# Patient Record
Sex: Female | Born: 1960 | Race: Black or African American | Hispanic: No | Marital: Single | Smoking: Light tobacco smoker
Health system: Southern US, Community
[De-identification: ages and names within clinical notes are randomized; demographics above are authoritative.]

## PROBLEM LIST (undated history)

## (undated) DIAGNOSIS — A64 Unspecified sexually transmitted disease: Secondary | ICD-10-CM

## (undated) DIAGNOSIS — D219 Benign neoplasm of connective and other soft tissue, unspecified: Secondary | ICD-10-CM

## (undated) DIAGNOSIS — B009 Herpesviral infection, unspecified: Secondary | ICD-10-CM

## (undated) DIAGNOSIS — I10 Essential (primary) hypertension: Secondary | ICD-10-CM

## (undated) HISTORY — PX: BREAST LUMPECTOMY: SHX2

## (undated) HISTORY — DX: Unspecified sexually transmitted disease: A64

## (undated) HISTORY — DX: Herpesviral infection, unspecified: B00.9

## (undated) HISTORY — DX: Benign neoplasm of connective and other soft tissue, unspecified: D21.9

---

## 1983-03-27 HISTORY — PX: BREAST SURGERY: SHX581

## 1997-09-11 ENCOUNTER — Emergency Department (HOSPITAL_COMMUNITY): Admission: EM | Admit: 1997-09-11 | Discharge: 1997-09-11 | Payer: Self-pay | Admitting: Emergency Medicine

## 1999-06-20 ENCOUNTER — Ambulatory Visit (HOSPITAL_COMMUNITY): Admission: AD | Admit: 1999-06-20 | Discharge: 1999-06-20 | Payer: Self-pay | Admitting: *Deleted

## 1999-06-20 ENCOUNTER — Ambulatory Visit (HOSPITAL_COMMUNITY): Admission: RE | Admit: 1999-06-20 | Discharge: 1999-06-20 | Payer: Self-pay | Admitting: *Deleted

## 1999-06-20 ENCOUNTER — Encounter (INDEPENDENT_AMBULATORY_CARE_PROVIDER_SITE_OTHER): Payer: Self-pay

## 1999-06-20 ENCOUNTER — Encounter: Payer: Self-pay | Admitting: *Deleted

## 2000-03-03 ENCOUNTER — Observation Stay (HOSPITAL_COMMUNITY): Admission: AD | Admit: 2000-03-03 | Discharge: 2000-03-04 | Payer: Self-pay | Admitting: *Deleted

## 2000-03-03 ENCOUNTER — Encounter (INDEPENDENT_AMBULATORY_CARE_PROVIDER_SITE_OTHER): Payer: Self-pay | Admitting: Specialist

## 2000-12-25 ENCOUNTER — Other Ambulatory Visit: Admission: RE | Admit: 2000-12-25 | Discharge: 2000-12-25 | Payer: Self-pay | Admitting: Obstetrics and Gynecology

## 2001-07-14 ENCOUNTER — Emergency Department (HOSPITAL_COMMUNITY): Admission: EM | Admit: 2001-07-14 | Discharge: 2001-07-14 | Payer: Self-pay | Admitting: *Deleted

## 2001-12-16 ENCOUNTER — Emergency Department (HOSPITAL_COMMUNITY): Admission: EM | Admit: 2001-12-16 | Discharge: 2001-12-16 | Payer: Self-pay | Admitting: Emergency Medicine

## 2003-05-24 ENCOUNTER — Inpatient Hospital Stay (HOSPITAL_COMMUNITY): Admission: AD | Admit: 2003-05-24 | Discharge: 2003-05-28 | Payer: Self-pay | Admitting: Obstetrics & Gynecology

## 2003-05-26 ENCOUNTER — Encounter (INDEPENDENT_AMBULATORY_CARE_PROVIDER_SITE_OTHER): Payer: Self-pay | Admitting: Specialist

## 2005-02-21 ENCOUNTER — Other Ambulatory Visit: Admission: RE | Admit: 2005-02-21 | Discharge: 2005-02-21 | Payer: Self-pay | Admitting: Obstetrics and Gynecology

## 2006-12-03 ENCOUNTER — Emergency Department (HOSPITAL_COMMUNITY): Admission: EM | Admit: 2006-12-03 | Discharge: 2006-12-03 | Payer: Self-pay | Admitting: Emergency Medicine

## 2007-01-14 ENCOUNTER — Ambulatory Visit: Payer: Self-pay | Admitting: Family Medicine

## 2007-01-15 ENCOUNTER — Ambulatory Visit: Payer: Self-pay | Admitting: *Deleted

## 2007-07-08 ENCOUNTER — Ambulatory Visit: Payer: Self-pay | Admitting: Internal Medicine

## 2007-08-12 ENCOUNTER — Emergency Department (HOSPITAL_COMMUNITY): Admission: EM | Admit: 2007-08-12 | Discharge: 2007-08-12 | Payer: Self-pay | Admitting: Emergency Medicine

## 2007-08-29 ENCOUNTER — Ambulatory Visit: Payer: Self-pay | Admitting: Family Medicine

## 2008-04-15 ENCOUNTER — Emergency Department (HOSPITAL_COMMUNITY): Admission: EM | Admit: 2008-04-15 | Discharge: 2008-04-15 | Payer: Self-pay | Admitting: Emergency Medicine

## 2008-07-27 ENCOUNTER — Ambulatory Visit: Payer: Self-pay | Admitting: Family Medicine

## 2008-07-30 ENCOUNTER — Ambulatory Visit (HOSPITAL_COMMUNITY): Admission: RE | Admit: 2008-07-30 | Discharge: 2008-07-30 | Payer: Self-pay | Admitting: Internal Medicine

## 2008-08-27 ENCOUNTER — Encounter: Admission: RE | Admit: 2008-08-27 | Discharge: 2008-08-27 | Payer: Self-pay | Admitting: Internal Medicine

## 2008-10-18 ENCOUNTER — Ambulatory Visit: Payer: Self-pay | Admitting: Family Medicine

## 2008-10-29 ENCOUNTER — Ambulatory Visit: Payer: Self-pay | Admitting: Family Medicine

## 2008-10-29 ENCOUNTER — Encounter (INDEPENDENT_AMBULATORY_CARE_PROVIDER_SITE_OTHER): Payer: Self-pay | Admitting: Family Medicine

## 2010-04-15 ENCOUNTER — Encounter: Payer: Self-pay | Admitting: Obstetrics & Gynecology

## 2010-04-16 ENCOUNTER — Encounter: Payer: Self-pay | Admitting: Obstetrics and Gynecology

## 2010-08-11 NOTE — Discharge Summary (Signed)
[facility]  Patient:    Julia Horton, Julia Horton                       MRN: [id number] Adm. Date:  [id number] Disc. Date: [id number] Attending:  Deniece Ree                           Discharge Summary  HISTORY OF PRESENT ILLNESS:    The patient is a 50 year old gravida 4, para 1, who has had two spontaneous abortions previous to this.  The patient was approximately [redacted] weeks gestation and was diagnosed at the time of evaluation in triage as having a spontaneous incomplete miscarriage.  HOSPITAL COURSE:                The patient was scheduled for dilatation and evacuation from which she underwent and tolerated the procedure well.  She did not have any complications.  Due to the mental status and the lateness of the time of this dilatation and evacuation, the patient was observed over the next 24 hours prior to discharge, and it was felt at that time she was mentally and physically ready for a discharge home.  FOLLOW-UP:                      She was told to return to my office in two weeks for follow-up evaluation or to call me prior to that time should any problems arise. DD:  03/31/00 TD:  03/31/00 Job: [id number]

## 2010-08-11 NOTE — Op Note (Signed)
[facility]  Patient:    Julia Horton, Julia Horton                       MRN: [id number] Adm. Date:  [id number] Attending:  Deniece Ree                           Operative Report  PREOPERATIVE DIAGNOSIS:       Fetal demise at approximately 10-[redacted] weeks gestation.  OPERATION:                    Dilatation and evacuation and curettage.  POSTOPERATIVE DIAGNOSES:      1. Fetal demise at approximately 10-[redacted] weeks                                  gestation.                               2. Pending pathology.  SURGEON:                      Deniece Ree, M.D.  ANESTHESIA:                   MAC.  ESTIMATED BLOOD LOSS:         Approximately 100 cc.  Patient tolerated the procedure well and returned to the recovery room in satisfactory condition.  PROCEDURE:                    Patient was taken to the operating room, prepped nd draped in the usual fashion for dilatation and evacuation.  Speculum was placed in the vagina following which the anterior lip of the cervix was grasped with a Christella Hartigan tenaculum.  A paracervical block using 1% Xylocaine solution was then instituted at the 5 oclock and 7 oclock positions.  Approximately 1 cc was also  placed in the cervical os.  The cervix was then dilated to a size 14 Pratt dilator at which time a size 9 suction curette was introduced through the cervical os and evacuation was carried out in a routine fashion without any difficulty. Following this a sharp curette was utilized to further scrape the uterus and myomas were palpable with the curette.  After this was felt to be clean and recleaned also ith the suction curette, the procedure was terminated.  The patient tolerated the procedure well and returned to the recovery room in satisfactory condition. The patient is to be discharged when fully alert.  She has been instructed on the possible complications ______ following this type of surgery.  She has  been told to return to my office in four weeks for follow-up evaluation or to call me prior to that time should any problems arise. DD:  06/20/99 TD:  06/20/99 Job: [id number]

## 2010-08-11 NOTE — H&P (Signed)
[facility]  Patient:    Julia Horton, Julia Horton                       MRN: [id number] Adm. Date:  [id number] Attending:  Deniece Ree                         History and Physical  HISTORY OF PRESENT ILLNESS:       Patient is a 50 year old gravida 4, para 1, and spontaneous AB 2 whose last menstrual period was December 01, 1999. The patient is approximately [redacted] weeks gestation. Patient began having vaginal bleeding and severe pelvic pain this p.m. at which time she came to triage for evaluation. At time of evaluation there were noted products of conception, all was protruded through the cervical os with moderate amount of bleeding.  PHYSICAL EXAMINATION:  VITAL SIGNS:                  At this time her temperature was 98.7, pulse 78, respirations 20, blood pressure 111/54.  HEENT:                        Within normal limits.  NECK:                         Supple.  BREASTS:                      Without masses, tenderness or discharge.  LUNGS:                        Clear to percussion, auscultation.  HEART:                        Normal sinus rhythm without murmur, rub, or gallop.  ABDOMEN:                      Benign with moderate tenderness in the mid lower quadrant.  EXTREMITIES:                  Within normal limits.  NEUROLOGIC:                   Within normal limits.  PELVIC:                       External genitalia and EGBUS within normal limits. The vagina had approximately 50 to 75 cc of blood and products of conception. The cervix was already dilated to a size 18 dilator. The uterus was approximately 12 weeks in size. The adnexa benign.  DIAGNOSIS:                    The diagnosis at this time was spontaneous and complete abortion.  PLAN:                         Dilatation and evacuation. DD:  03/03/00 TD:  03/03/00 Job: [id number]

## 2010-08-11 NOTE — H&P (Signed)
[facility]  Patient:    Julia Horton, Julia Horton                       MRN: [id number] Adm. Date:  [id number] Attending:  Deniece Ree                         History and Physical  HISTORY:                      Patient is a 50 year old female who is approximately 10-[redacted] weeks pregnant who was evaluated in my office today and also by ultrasound and found that this patient indeed had a fetal demise.  This was explained to the patient and she was then scheduled for a dilatation and evacuation.  PAST MEDICAL HISTORY:         Significant in that two years prior to this time patient had a previous spontaneous abortion at approximately 8-[redacted] weeks gestation.  PHYSICAL EXAMINATION:  GENERAL:                      Revealed a well-developed, well-nourished female n no acute distress.  HEENT:                        Within normal limits.  NECK:                         Supple.  BREASTS:                      Without masses, tenderness, or discharge.  LUNGS:                        Clear to percussion and auscultation.  HEART:                        Normal sinus rhythm without murmur, rub, or gallop.  ABDOMEN:                      Benign.  EXTREMITIES:                  Within normal limits.  NEUROLOGIC:                   Within normal limits.  PELVIC:                       Revealed external genitalia, BUS to be within normal limits.  The vagina had approximately 10-15 cc of blood in the vault and also blood protruding through the cervical os.  The uterus examination reveals a uterus that was approximately 14-16 weeks in size with numerous myomas present.  Adnexa are  benign.  DIAGNOSIS:                    Spontaneous abortion with fetal demise.  PLAN:                         Dilatation and evacuation. DD:  06/20/99 TD:  06/20/99 Job: [id number]

## 2010-08-11 NOTE — Op Note (Signed)
NAMECHRISTELLE, Julia Horton NO.:  [id number]   MEDICAL RECORD NO.:  [id number]                   PATIENT TYPE:  INP   LOCATION:  9320                                 FACILITY:  [facility]   PHYSICIAN:  Roseanna Rainbow, M.D.         DATE OF BIRTH:  October 27, 1960   DATE OF PROCEDURE:  05/26/2003  DATE OF DISCHARGE:                                 OPERATIVE REPORT   PREOPERATIVE DIAGNOSIS:  Fifteen week septic abortion with retained  placenta.   POSTOPERATIVE DIAGNOSIS:  Fifteen week septic abortion with retained  placenta.   PROCEDURE:  Suction dilatation and evacuation.   SURGEON:  Roseanna Rainbow, M.D.   ANESTHESIA:  Paracervical block, managed anesthesia care.   COMPLICATIONS:  None.   ESTIMATED BLOOD LOSS:  Approximately 200 mL.   DESCRIPTION OF PROCEDURE:  The patient was taken to the operating room.  A  sterile speculum was placed in the patient's vagina and the cervix noted to  be 2 cm dilated.  The cervix and vagina were swabbed with Betadine.  Lidocaine 2 mL was injected into the anterior lip of the cervix.  An empty  sponge stick was then applied to this location and 5 mL of lidocaine was  injected at 4 and 7 o'clock to produce a paracervical block.  Using  ultrasound guidance, a suction curette was advanced gently to the uterine  fundus, the suction device was activated, and the curette rotated to clear  the uterus of the remaining products of conception.  Ovum-crushing forceps  were used to also ultimately grasp the retained products of conception.  A  sharp curettage was performed again using ultrasound guidance.  The  appearance of the endometrial stripe at the conclusion of the case was  consistent with removal of the remaining products of conception.  There was  minimal bleeding noted and the sponge stick removed from the anterior lip of  the cervix with adequate hemostasis noted.  The patient tolerated the  procedure well.  The  patient was taken to the PACU in stable condition.   PATHOLOGY:  Products of conception.                                               Roseanna Rainbow, M.D.    [name]  D:  05/27/2003  T:  05/27/2003  Job:  [id number]

## 2010-08-11 NOTE — Op Note (Signed)
[facility]  Patient:    Julia Horton, Julia Horton                       MRN: [id number] Proc. Date: 03/03/00 Adm. Date:  [id number] Attending:  Deniece Ree                           Operative Report  PREOPERATIVE DIAGNOSES:       Spontaneous incomplete abortion with retained products.  POSTOPERATIVE DIAGNOSES:      Spontaneous incomplete abortion with retained products.  OPERATION:                    Dilatation and evacuation and curettage.  SURGEON:                      Deniece Ree, M.D.  ANESTHESIA:                   MAC.  ESTIMATED BLOOD LOSS:         200 cc.  COMPLICATIONS:                None. The patient tolerated the procedure well and returned to the recovery room in satisfactory condition.  DESCRIPTION OF PROCEDURE:     Patient was taken to the operating room, prepped and draped in the usual fashion for dilatation and evacuation. The anterior lip of the cervix was grasped with the [facility] tenaculum. A paracervical block was instituted using 5 cc of 1% Xylocaine bilaterally followed which with a size 10 suction curet. Evacuation of the uterine cavity was then carried out without any problems. After this was felt was to be cleaned, a sharp curet was used and curettage was carried out. The uterus was felt to be clean. At this time procedure was terminated with minimal bleeding at the present time. The patient tolerated the procedure well and returned to the recovery room in satisfactory condition. The patient will be admitted for observation and discharged in less than 24 hours. DD:  03/03/00 TD:  03/03/00 Job: [id number]

## 2010-08-11 NOTE — Discharge Summary (Signed)
NAMEMAANYA, HIPPERT NO.:  [id number]   MEDICAL RECORD NO.:  [id number]                   PATIENT TYPE:  INP   LOCATION:  9320                                 FACILITY:  [facility]   PHYSICIAN:  Roseanna Rainbow, M.D.         DATE OF BIRTH:  Mar 09, 1961   DATE OF ADMISSION:  05/24/2003  DATE OF DISCHARGE:  05/28/2003                                 DISCHARGE SUMMARY   CHIEF COMPLAINT:  Patient is a 50 year old, para 1, with an intrauterine  pregnancy at 15+ weeks with preterm premature rupture of membranes and lower  abdominal cramping.   HISTORY OF PRESENT ILLNESS:  The patient reports leaking fluid for one week.  She was seen in the office earlier today. On speculum exam, the cervix was  closed with purulent discharge for a negative ultrasound on the day of  presentation with hydramnios and positive fetal heart rate seen in the  vaginal vault.   PAST OB/GYN HISTORY:  Cesarean delivery at term; spontaneous abortion first  trimester times two; uterine fibroids; history of gonorrhea and Chlamydia.   PAST MEDICAL HISTORY:  She denies.   PAST SURGICAL HISTORY:  See above. She is status post left breast lumpectomy  for benign disease.   MEDICATIONS:  Prenatal vitamins.   ALLERGIES:  No known drug allergies.   PHYSICAL EXAMINATION:  VITAL SIGNS: Stable, afebrile.  ABDOMEN: Nontender.  PELVIC EXAM: Deferred.   ASSESSMENT/PLAN:  Second trimester preterm premature rupture of membranes,  inevitable abortion.   PLAN:  Admission and Cytotec augmentation.   HOSPITAL COURSE:  The patient was admitted. She was given several doses of  Cytotec intravaginally.  She then passed the fetus; however, the placenta  remained in situ and the patient had heavy bleeding. At which point the  decision was made to bring the patient for suction D&E. Please see the  dictated operative summary for further details.  She was discharged two days  postoperatively.  She had  received parenteral antibiotics during the Cytotec  process, and these were discontinued.   DISCHARGE DIAGNOSES:  1. Second trimester preterm premature rupture of membranes.  2. Inevitable septic abortion.   PROCEDURE:  Suction dilatation and extraction.   CONDITION ON DISCHARGE:  Stable.   DIET:  Regular.   ACTIVITY:  Pelvic rest.   MEDICATIONS:  Percocet, ibuprofen, ferrous sulfate, doxycycline, and Flagyl.   DISPOSITION:  The patient is to follow up in the office on June 11, 2003,  at 11:30 a.m.                                               Roseanna Rainbow, M.D.    [name]  D:  06/23/2003  T:  06/23/2003  Job:  [id number]

## 2010-09-28 ENCOUNTER — Emergency Department (HOSPITAL_COMMUNITY)
Admission: EM | Admit: 2010-09-28 | Discharge: 2010-09-28 | Disposition: A | Discharge status: Home / Self Care | Payer: Self-pay | Attending: Emergency Medicine | Admitting: Emergency Medicine

## 2010-09-28 ENCOUNTER — Emergency Department (HOSPITAL_COMMUNITY): Payer: Self-pay

## 2010-09-28 DIAGNOSIS — N201 Calculus of ureter: Secondary | ICD-10-CM | POA: Insufficient documentation

## 2010-09-28 DIAGNOSIS — I10 Essential (primary) hypertension: Secondary | ICD-10-CM | POA: Insufficient documentation

## 2010-09-28 DIAGNOSIS — R109 Unspecified abdominal pain: Secondary | ICD-10-CM | POA: Insufficient documentation

## 2010-09-28 DIAGNOSIS — R6883 Chills (without fever): Secondary | ICD-10-CM | POA: Insufficient documentation

## 2010-09-28 DIAGNOSIS — R300 Dysuria: Secondary | ICD-10-CM | POA: Insufficient documentation

## 2010-09-28 DIAGNOSIS — R112 Nausea with vomiting, unspecified: Secondary | ICD-10-CM | POA: Insufficient documentation

## 2010-09-28 DIAGNOSIS — R1032 Left lower quadrant pain: Secondary | ICD-10-CM | POA: Insufficient documentation

## 2010-09-28 DIAGNOSIS — R197 Diarrhea, unspecified: Secondary | ICD-10-CM | POA: Insufficient documentation

## 2010-09-28 LAB — URINALYSIS, ROUTINE W REFLEX MICROSCOPIC
Bilirubin Urine: NEGATIVE
Glucose, UA: NEGATIVE mg/dL
Hgb urine dipstick: NEGATIVE
Ketones, ur: 15 mg/dL — AB
Leukocytes, UA: NEGATIVE
Nitrite: NEGATIVE
Protein, ur: NEGATIVE mg/dL
Urobilinogen, UA: 0.2 mg/dL (ref 0.0–1.0)
pH: 7.5 (ref 5.0–8.0)

## 2010-09-28 LAB — POCT PREGNANCY, URINE: Preg Test, Ur: NEGATIVE

## 2010-09-28 LAB — URINE MICROSCOPIC-ADD ON

## 2010-11-30 ENCOUNTER — Inpatient Hospital Stay (INDEPENDENT_AMBULATORY_CARE_PROVIDER_SITE_OTHER)
Admission: RE | Admit: 2010-11-30 | Discharge: 2010-11-30 | Disposition: A | Discharge status: Home / Self Care | Payer: Self-pay | Source: Ambulatory Visit | Attending: Emergency Medicine | Admitting: Emergency Medicine

## 2010-11-30 DIAGNOSIS — M549 Dorsalgia, unspecified: Secondary | ICD-10-CM

## 2010-11-30 DIAGNOSIS — M62838 Other muscle spasm: Secondary | ICD-10-CM

## 2010-11-30 DIAGNOSIS — M799 Soft tissue disorder, unspecified: Secondary | ICD-10-CM

## 2010-11-30 LAB — WET PREP, GENITAL

## 2010-11-30 LAB — POCT PREGNANCY, URINE: Preg Test, Ur: NEGATIVE

## 2010-11-30 LAB — POCT URINALYSIS DIP (DEVICE)
Ketones, ur: NEGATIVE mg/dL
Protein, ur: NEGATIVE mg/dL
Specific Gravity, Urine: >=1.03 (ref 1.005–1.030)

## 2012-12-31 IMAGING — CT CT ABD-PELV W/O CM
3 of 4 series · 14 of 32 positions shown, 19 images · non-contrast
Comparison: None.

CLINICAL DATA: CT ABDOMEN AND PELVIS WITHOUT CONTRAST
TECHNIQUE: Multidetector CT imaging of the abdomen and pelvis was
performed following the standard protocol without intravenous
contrast.

[Series 2: renal stone · axial · 0.70mm/px · z∈[-386,-126]mm · 4 of 88 slices shown, 9 images]
[im 18/88  soft-tissue]
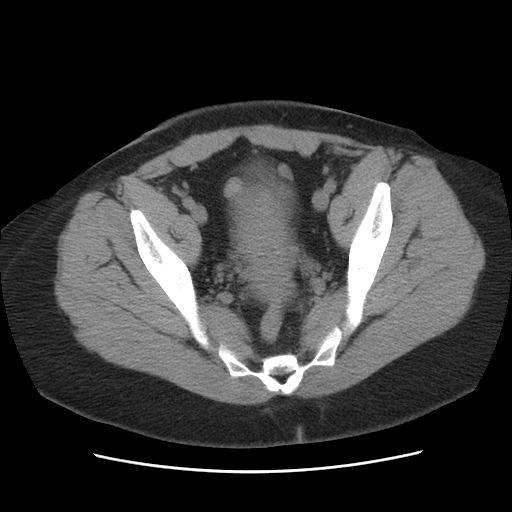
[im 18/88  lung]
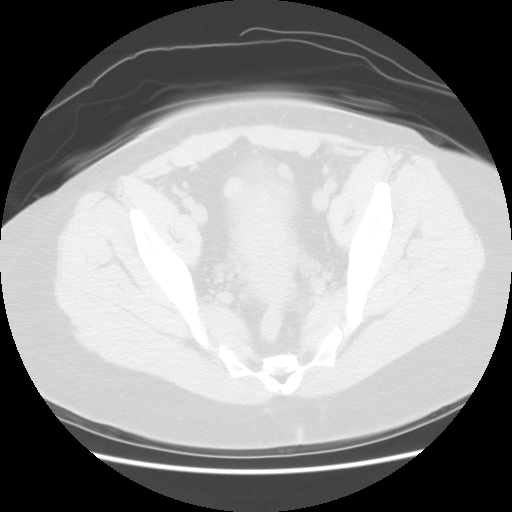
[im 18/88  bone]
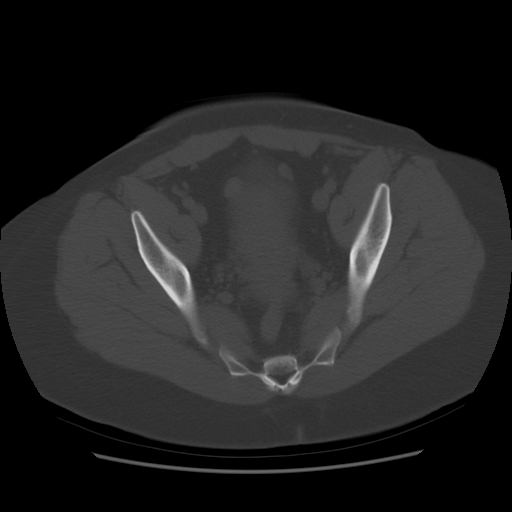
[im 35/88  soft-tissue]
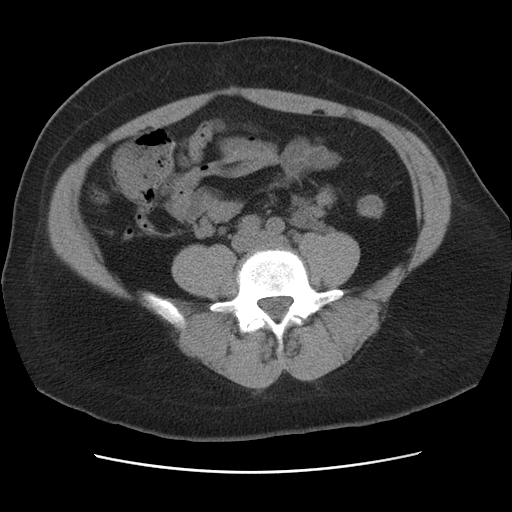
[im 35/88  lung]
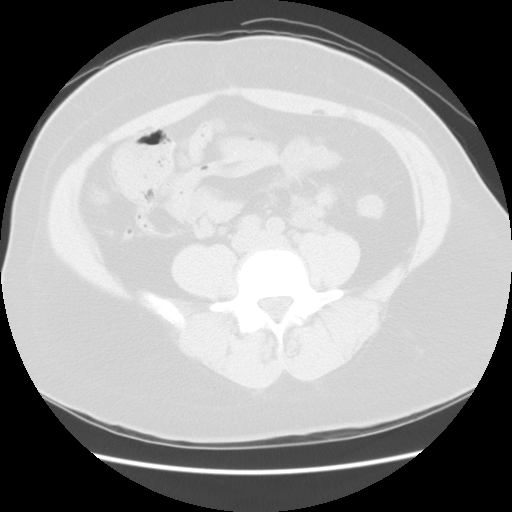
[im 53/88  soft-tissue]
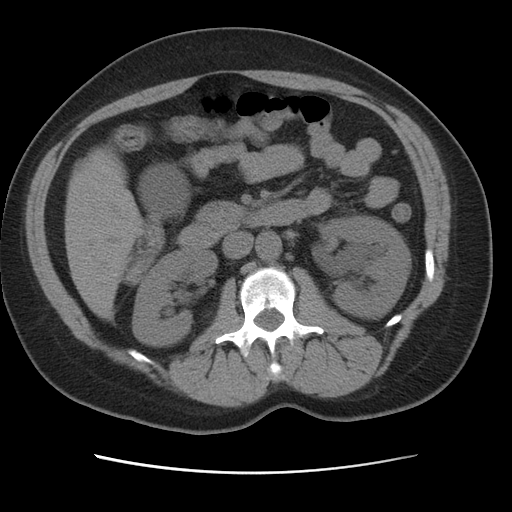
[im 53/88  lung]
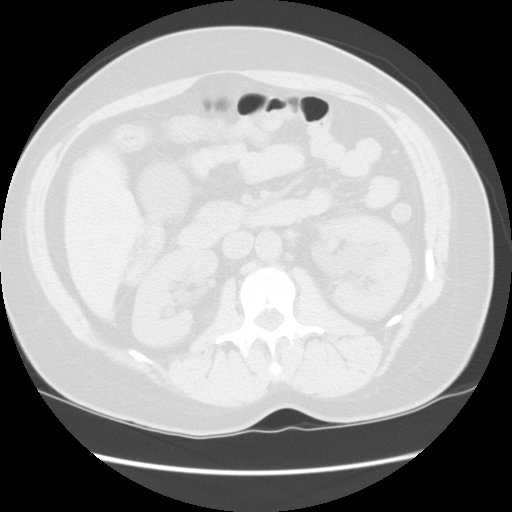
[im 70/88  soft-tissue]
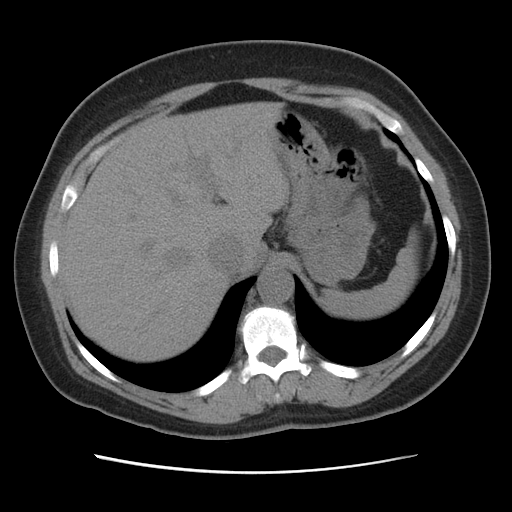
[im 70/88  lung]
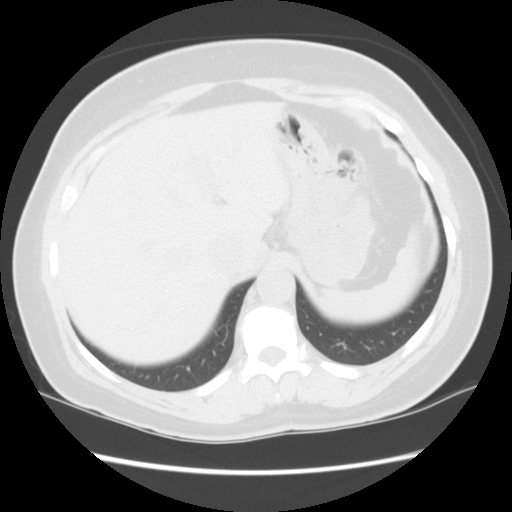

[Series 400: cor · coronal · 0.94mm/px · 2 of 155 slices shown]
[im 16/155  soft-tissue]
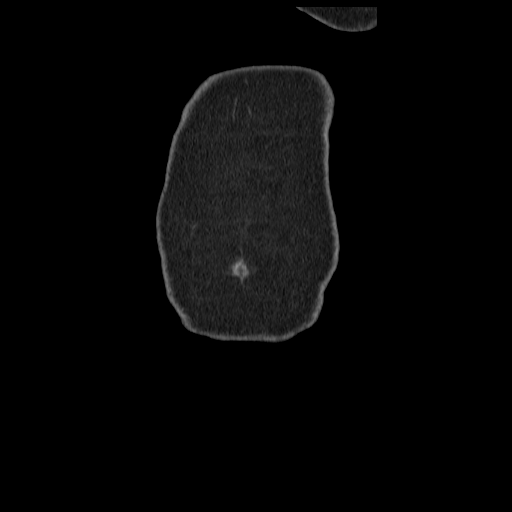
[im 31/155  soft-tissue]
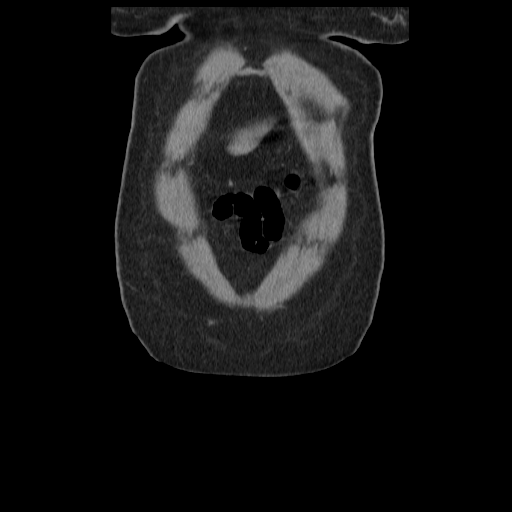

[Series 401: sag · sagittal · 0.94mm/px · 8 of 179 slices shown]
[im 15/179  soft-tissue]
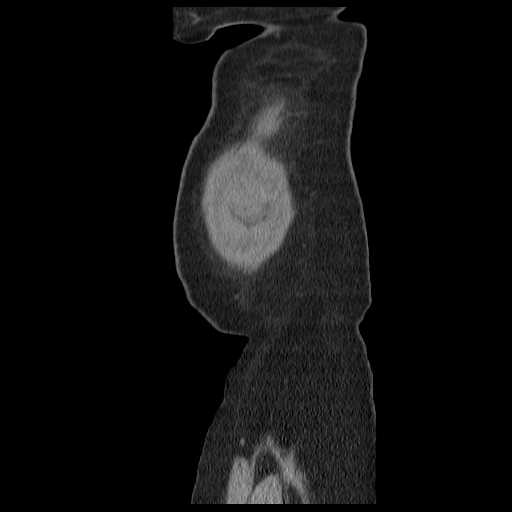
[im 45/179  soft-tissue]
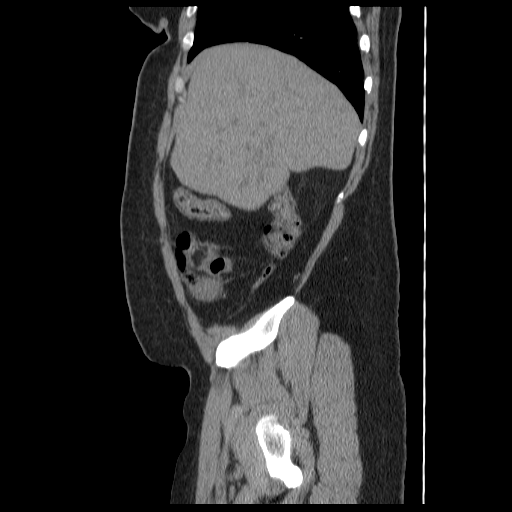
[im 60/179  soft-tissue]
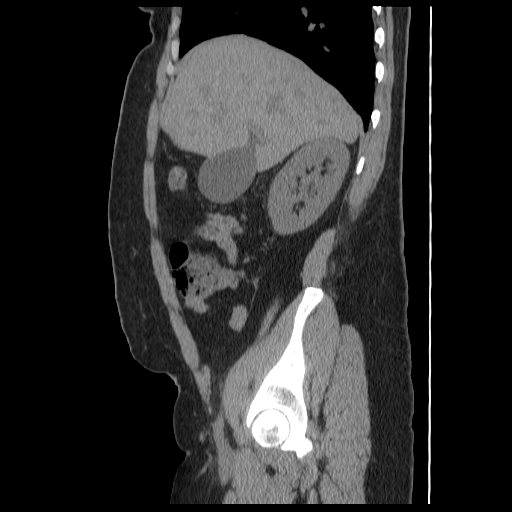
[im 75/179  soft-tissue]
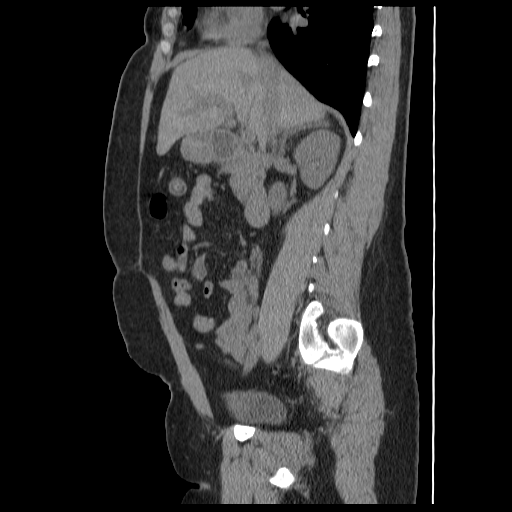
[im 104/179  soft-tissue]
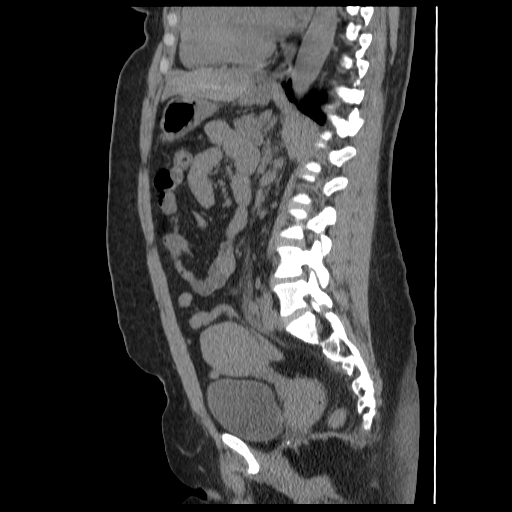
[im 119/179  soft-tissue]
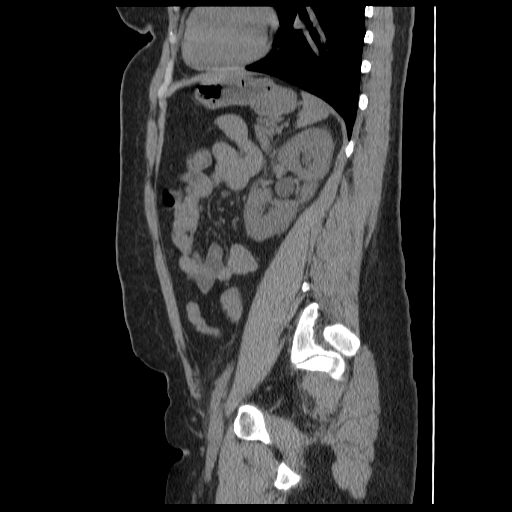
[im 134/179  soft-tissue]
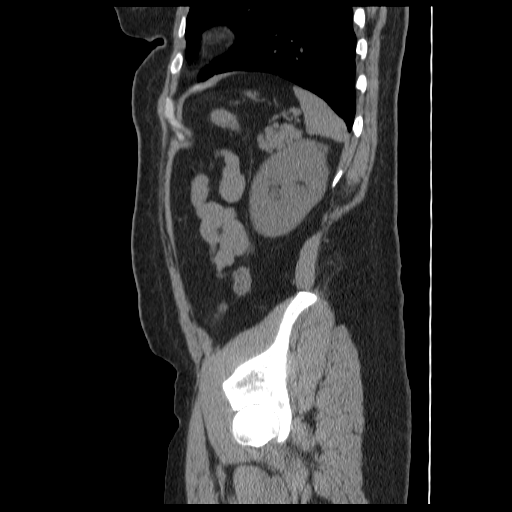
[im 164/179  soft-tissue]
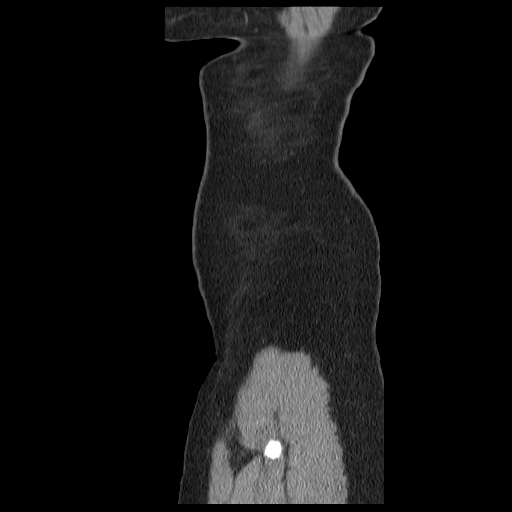

[14 of 32 positions shown; findings below may reference images not displayed]

FINDINGS: Minimal bibasilar atelectasis.  Otherwise clear lung
bases.  Normal heart size.  Trace pericardial fluid versus
thickening.  No pleural effusion.

Low attenuation of the blood pool suggests anemia.

In the absence of intravenous contrast, there is limited evaluation
of intra-abdominal organs.

Within this limitation, unremarkable liver, biliary system, spleen,
pancreas, and adrenal glands.  Normal right kidney.  No right-sided
hydronephrosis.

The left kidney is edematous with mild perinephric and periureteral
fat stranding.  There is a mild hydroureteronephrosis to the level
of a 4 mm stone at the left UVJ.

Nonobstructive bowel.  Normal appendix.

Thin-walled bladder.  Enlarged lobular uterus with multiple
exophytic soft tissue densities, most in keeping with fibroids.
Trace free fluid within the pelvis.  No adnexal mass.

No mesenteric or retroperitoneal lymphadenopathy.

No aneurysmal dilatation of the aorta.

No aggressive osseous lesions.
IMPRESSION: Mild left hydroureteronephrosis and left perinephric and
periureteral fat stranding secondary to a 4 mm left UVJ stone.

Fibroid uterus.

## 2013-03-22 ENCOUNTER — Emergency Department (HOSPITAL_COMMUNITY)
Admission: EM | Admit: 2013-03-22 | Discharge: 2013-03-22 | Disposition: A | Discharge status: Home / Self Care | Payer: Self-pay | Attending: Emergency Medicine | Admitting: Emergency Medicine

## 2013-03-22 ENCOUNTER — Encounter (HOSPITAL_COMMUNITY): Payer: Self-pay | Admitting: Emergency Medicine

## 2013-03-22 DIAGNOSIS — I10 Essential (primary) hypertension: Secondary | ICD-10-CM | POA: Insufficient documentation

## 2013-03-22 DIAGNOSIS — M722 Plantar fascial fibromatosis: Secondary | ICD-10-CM | POA: Insufficient documentation

## 2013-03-22 HISTORY — DX: Essential (primary) hypertension: I10

## 2013-03-22 MED ORDER — MELOXICAM 7.5 MG PO TABS: 7.5000 mg | ORAL_TABLET | Freq: Every day | ORAL | Status: DC

## 2013-03-22 NOTE — ED Notes (Signed)
Pt states R sided foot pain for several months. States she is on feet frequently at work. 5/10 pain, becomes worse with weight bearing. Denies recent injury. Pt is alert and oriented x4. Able to wiggle digits. No swelling noted. Pedal pulses present.

## 2013-03-22 NOTE — ED Provider Notes (Signed)
Medical screening examination/treatment/procedure(s) were performed by non-physician practitioner and as supervising physician I was immediately available for consultation/collaboration.  EKG Interpretation   None        Shon Baton, MD 03/22/13 [phone]

## 2013-03-22 NOTE — ED Notes (Signed)
Pt discharged home. Vital signs stable. Has no further questions.

## 2013-03-22 NOTE — ED Notes (Signed)
Pt c/o right foot pain x 1.5 months; pt denies obvious injury but sts increased pain with standing

## 2013-03-22 NOTE — ED Provider Notes (Signed)
CSN: [id number]     Arrival date & time 03/22/13  4132 History   First MD Initiated Contact with Patient 03/22/13 0919     Chief Complaint  Patient presents with  . Foot Pain   (Consider location/radiation/quality/duration/timing/severity/associated sxs/prior Treatment) HPI Patient reports 1.5 months of right heel pain.  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  States that the pain is sharp and intense, occasionally hurts around the base of her ankle but mostly is in the plantar aspect of her heel.  Pain is worst in the first step following sitting and resting the foot.  Denies any injury or trauma to the foot.  Denies fevers, chills, weakness or numbness.  Has taken one dose of ibuprofen with mild improvement.   Past Medical History  Diagnosis Date  . Hypertension    History reviewed. No pertinent past surgical history. History reviewed. No pertinent family history. History  Substance Use Topics  . Smoking status: Never Smoker   . Smokeless tobacco: Not on file  . Alcohol Use: Yes   OB History   Grav Para Term Preterm Abortions TAB SAB Ect Mult Living                 Review of Systems  Constitutional: Negative for fever and chills.  Musculoskeletal: Negative for arthralgias and myalgias.  Skin: Negative for color change and wound.  Allergic/Immunologic: Negative for immunocompromised state.  Neurological: Negative for weakness and numbness.  Hematological: Does not bruise/bleed easily.    Allergies  Review of patient's allergies indicates no known allergies.  Home Medications   Current Outpatient Rx  Name  Route  Sig  Dispense  Refill  . meloxicam (MOBIC) 7.5 MG tablet   Oral   Take 1-2 tablets (7.5-15 mg total) by mouth daily.   30 tablet   0    BP 174/100  Pulse 80  Temp(Src) 97.9 F (36.6 C) (Oral)  Resp 18  Wt 190 lb (86.183 kg)  SpO2 98% Physical Exam  Nursing note and vitals reviewed. Constitutional: She appears well-developed and  well-nourished. No distress.  HENT:  Head: Normocephalic and atraumatic.  Neck: Neck supple.  Pulmonary/Chest: Effort normal.  Musculoskeletal:       Right ankle: Normal.       Right foot: She exhibits tenderness. She exhibits normal range of motion, no bony tenderness, no swelling, normal capillary refill, no crepitus, no deformity and no laceration.       Feet:  No erythema, edema, warmth.   Neurological: She is alert.  Skin: She is not diaphoretic.    ED Course  Procedures (including critical care time) Labs Review Labs Reviewed - No data to display Imaging Review No results found.  EKG Interpretation   None       MDM   1. Plantar fasciitis of right foot    Pt with s/s c/w plantar fasciitis.  Discussed conservative home treatment and antiinflammatory meds, podiatry follow up.  Discussed findings, treatment, and follow up  with patient.  Pt given return precautions.  Pt verbalizes understanding and agrees with plan.      I doubt any other EMC precluding discharge at this time including, but not necessarily limited to the following: fracture, septic joint    Trixie Dredge, PA-C 03/22/13 1005

## 2014-05-08 ENCOUNTER — Encounter (HOSPITAL_COMMUNITY): Payer: Self-pay | Admitting: Emergency Medicine

## 2014-05-08 ENCOUNTER — Emergency Department (HOSPITAL_COMMUNITY)
Admission: EM | Admit: 2014-05-08 | Discharge: 2014-05-09 | Disposition: A | Discharge status: Home / Self Care | Payer: BLUE CROSS/BLUE SHIELD | Attending: Emergency Medicine | Admitting: Emergency Medicine

## 2014-05-08 DIAGNOSIS — Y9389 Activity, other specified: Secondary | ICD-10-CM | POA: Diagnosis not present

## 2014-05-08 DIAGNOSIS — I10 Essential (primary) hypertension: Secondary | ICD-10-CM | POA: Insufficient documentation

## 2014-05-08 DIAGNOSIS — Z23 Encounter for immunization: Secondary | ICD-10-CM | POA: Insufficient documentation

## 2014-05-08 DIAGNOSIS — T2661XA Corrosion of cornea and conjunctival sac, right eye, initial encounter: Secondary | ICD-10-CM | POA: Diagnosis not present

## 2014-05-08 DIAGNOSIS — Y9289 Other specified places as the place of occurrence of the external cause: Secondary | ICD-10-CM | POA: Diagnosis not present

## 2014-05-08 DIAGNOSIS — H5711 Ocular pain, right eye: Secondary | ICD-10-CM | POA: Diagnosis present

## 2014-05-08 DIAGNOSIS — Y998 Other external cause status: Secondary | ICD-10-CM | POA: Diagnosis not present

## 2014-05-08 DIAGNOSIS — T543X3A Toxic effect of corrosive alkalis and alkali-like substances, assault, initial encounter: Secondary | ICD-10-CM | POA: Insufficient documentation

## 2014-05-08 MED ORDER — FLUORESCEIN SODIUM 1 MG OP STRP
1.0000 | ORAL_STRIP | Freq: Once | OPHTHALMIC | Status: AC
  Administered 2014-05-08: 1 via OPHTHALMIC

## 2014-05-08 MED ORDER — OXYCODONE-ACETAMINOPHEN 5-325 MG PO TABS
1.0000 | ORAL_TABLET | Freq: Once | ORAL | Status: AC
  Administered 2014-05-08: 1 via ORAL
  Filled 2014-05-08: qty 1

## 2014-05-08 MED ORDER — OXYCODONE-ACETAMINOPHEN 5-325 MG PO TABS: 1.0000 | ORAL_TABLET | Freq: Three times a day (TID) | ORAL | Status: DC | PRN

## 2014-05-08 MED ORDER — OXYCODONE-ACETAMINOPHEN 5-325 MG PO TABS: 1.0000 | ORAL_TABLET | Freq: Once | ORAL | Status: DC

## 2014-05-08 MED ORDER — ERYTHROMYCIN 5 MG/GM OP OINT
1.0000 "application " | TOPICAL_OINTMENT | Freq: Once | OPHTHALMIC | Status: AC
  Administered 2014-05-08: 1 via OPHTHALMIC
  Filled 2014-05-08: qty 3.5

## 2014-05-08 MED ORDER — TETRACAINE HCL 0.5 % OP SOLN
2.0000 [drp] | Freq: Once | OPHTHALMIC | Status: AC
  Administered 2014-05-08: 2 [drp] via OPHTHALMIC

## 2014-05-08 MED ORDER — TETRACAINE HCL 0.5 % OP SOLN
2.0000 [drp] | Freq: Once | OPHTHALMIC | Status: AC
  Administered 2014-05-08: 2 [drp] via OPHTHALMIC
  Filled 2014-05-08: qty 2

## 2014-05-08 NOTE — ED Notes (Signed)
Lilia Pro lens was thrown away.  Notified SPD for a replacement.  Rona Ravens, [place] notified.

## 2014-05-08 NOTE — ED Notes (Signed)
Sixth bag of fluid finished. Dr. Ellender Hose aware. PH tested. To do a 7th bag.

## 2014-05-08 NOTE — ED Notes (Signed)
7th bag of fluid started.

## 2014-05-08 NOTE — ED Notes (Signed)
8th bag of NS started

## 2014-05-08 NOTE — ED Notes (Signed)
Morgan lens reapplied to the Right eye.

## 2014-05-08 NOTE — ED Notes (Addendum)
Eye irrigation kid at the bedside.
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
## 2014-05-08 NOTE — ED Notes (Signed)
Started 5th bag NS to right eye via morgans lens.

## 2014-05-08 NOTE — ED Notes (Signed)
Lilia Pro lens is in place.

## 2014-05-08 NOTE — ED Notes (Signed)
Pt significant other asking to go back to exam room at this time. This RN told significant other patient was requesting no visitors at this time. Significant other became angry, RN repeated no visitors would be allowed. Significant other attempting to sneak back into treatment area. Significant other was stopped by [employer]. Currently sitting in lobby at this time.

## 2014-05-08 NOTE — ED Notes (Signed)
8th bag of fluid stopped. Per MD Ellender Hose, RN to manual irrigate eye with NS 1-2L.

## 2014-05-08 NOTE — ED Notes (Signed)
Right eye irrigation infusing 4th liter.

## 2014-05-08 NOTE — ED Notes (Signed)
5th liter of NS inflused.  Dr. Ellender Hose aware.
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
## 2014-05-08 NOTE — ED Notes (Signed)
7th bag of fluid stopped and morgan lens taken out. Resident notified to check [place].

## 2014-05-08 NOTE — ED Notes (Signed)
6th liter of NS infusing to right eye.
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
## 2014-05-09 MED ORDER — ACETAMINOPHEN 325 MG PO TABS
650.0000 mg | ORAL_TABLET | Freq: Once | ORAL | Status: AC
  Administered 2014-05-09: 650 mg via ORAL

## 2014-05-09 MED ORDER — TETANUS-DIPHTH-ACELL PERTUSSIS 5-2.5-18.5 LF-MCG/0.5 IM SUSP
0.5000 mL | Freq: Once | INTRAMUSCULAR | Status: DC
  Filled 2014-05-09: qty 0.5

## 2014-05-09 MED ORDER — SODIUM CHLORIDE 0.9 % IR SOLN: 1000.0000 mL | Status: DC | PRN

## 2014-05-09 MED ORDER — SODIUM CHLORIDE 0.9 % IV BOLUS (SEPSIS)
1000.0000 mL | INTRAVENOUS | Status: DC | PRN
  Administered 2014-05-08 – 2014-05-09 (×11): 1000 mL via INTRAVENOUS

## 2014-05-09 NOTE — ED Notes (Signed)
The tech assisted the RN in flushing the right eye.

## 2014-05-09 NOTE — ED Notes (Signed)
All IV fluids were used to flush right eye, not given intravenous.

## 2014-05-09 NOTE — ED Notes (Signed)
Checked pH with EDP, pH at this time similar in both eyes.

## 2014-05-09 NOTE — ED Notes (Signed)
PH remains 7.5. 18 saline bags on board at this time.

## 2014-05-09 NOTE — ED Provider Notes (Signed)
Patient now received greater than 20 L of normal saline irrigation. PH check is similar in both eye near 7. Pt pain is improved and as well as visual acuity. Reiterated need to f/u with ophthalmology tomorrow at 9. Return precautions given.   Julianne Rice, MD 05/09/14 [phone]

## 2014-10-01 ENCOUNTER — Ambulatory Visit (INDEPENDENT_AMBULATORY_CARE_PROVIDER_SITE_OTHER): Payer: BLUE CROSS/BLUE SHIELD | Admitting: Obstetrics and Gynecology

## 2014-10-01 ENCOUNTER — Encounter: Payer: Self-pay | Admitting: Obstetrics and Gynecology

## 2014-10-01 VITALS — BP 150/100 | HR 70 | Resp 16 | Ht 62.0 in | Wt 194.0 lb

## 2014-10-01 DIAGNOSIS — Z1211 Encounter for screening for malignant neoplasm of colon: Secondary | ICD-10-CM | POA: Diagnosis not present

## 2014-10-01 DIAGNOSIS — R6882 Decreased libido: Secondary | ICD-10-CM

## 2014-10-01 DIAGNOSIS — N951 Menopausal and female climacteric states: Secondary | ICD-10-CM

## 2014-10-01 DIAGNOSIS — Z0000 Encounter for general adult medical examination without abnormal findings: Secondary | ICD-10-CM

## 2014-10-01 DIAGNOSIS — Z113 Encounter for screening for infections with a predominantly sexual mode of transmission: Secondary | ICD-10-CM

## 2014-10-01 DIAGNOSIS — Z01419 Encounter for gynecological examination (general) (routine) without abnormal findings: Secondary | ICD-10-CM

## 2014-10-01 LAB — POCT URINALYSIS DIPSTICK
BILIRUBIN UA: NEGATIVE
Blood, UA: NEGATIVE
Glucose, UA: NEGATIVE
KETONES UA: NEGATIVE
LEUKOCYTES UA: NEGATIVE
NITRITE UA: NEGATIVE
PROTEIN UA: NEGATIVE
Urobilinogen, UA: NEGATIVE
pH, UA: 5

## 2014-10-01 NOTE — Patient Instructions (Signed)

## 2014-10-01 NOTE — Progress Notes (Signed)
Patient to triage to schedule appointment to establish Primary care.  Attempted appointment at [facility] scheduled for 10/20/14. However, patient did not want to wait for that appointment. Advised if would like to be seen sooner, can always go to [facility] for Urgent care evaluation and then establish care with their office. [facility], appointment scheduled for 10/12/14 for HTN evaluation, patient states if she has time today, she will go be seen in urgent care, if not will keep appointment at that office.  Per patient request, cancelled [place] appointment. Patient will call if needs any further assistance.
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
## 2014-10-02 LAB — WET PREP BY MOLECULAR PROBE
CANDIDA SPECIES: NEGATIVE
Gardnerella vaginalis: POSITIVE — AB
TRICHOMONAS VAG: NEGATIVE

## 2014-10-02 LAB — GC/CHLAMYDIA PROBE AMP, URINE
CHLAMYDIA, SWAB/URINE, PCR: NEGATIVE
GC Probe Amp, Urine: NEGATIVE

## 2014-10-05 ENCOUNTER — Other Ambulatory Visit: Payer: Self-pay | Admitting: *Deleted

## 2014-10-05 LAB — IPS PAP TEST WITH HPV

## 2014-10-05 MED ORDER — METRONIDAZOLE 500 MG PO TABS: 500.0000 mg | ORAL_TABLET | Freq: Two times a day (BID) | ORAL | Status: DC

## 2014-10-07 ENCOUNTER — Telehealth: Payer: Self-pay

## 2014-10-07 DIAGNOSIS — IMO0002 Reserved for concepts with insufficient information to code with codable children: Secondary | ICD-10-CM

## 2014-10-07 NOTE — Telephone Encounter (Signed)
-----   Message from Nunzio Cobbs, MD sent at 10/06/2014  9:25 PM EDT ----- Please inform patient of her pap showing normal cells and positive HR HPV.  Her bacterial vaginosis is already under treatment.  I am recommending she returns for colposcopy to evaluate her cervix for potential hidden dysplasia.  Patient is new in my care, and we are working on a lot of issues to get her health care up to date. We will get there, step by step! She has an appointment with me on 11/04/14 for a recheck, but this would need to be changed to a colposcopy appointment and labs also.  Please adjust the time of the appointment as needed to fall into a procedure slot. The colposcopy will also need to be precerted.   Thank you!  Cc- Marisa Sprinkles

## 2014-10-07 NOTE — Telephone Encounter (Addendum)
Attempted to reach patient at number provided [phone]. Phone rang and rang with no answer. No voicemail box is available to leave a message.

## 2014-10-12 ENCOUNTER — Ambulatory Visit: Payer: BLUE CROSS/BLUE SHIELD | Admitting: Family Medicine

## 2014-10-12 NOTE — Telephone Encounter (Signed)
Left message to call Julia Horton at [phone]. 

## 2014-10-13 NOTE — Telephone Encounter (Signed)
Returning call.

## 2014-10-14 NOTE — Telephone Encounter (Signed)
Spoke with patient. Advised of results and message as seen below from [place]. Patient is agreeable and verbalizes understanding. Colposcopy appointment scheduled for 11/05/2014 at 3pm. Appointment for 11/04/2014 cancelled as patient needs later appointment. Advised these appointment will be combined at her 8/12 appointment. Patient is agreeable.  Instructions given. Motrin 800 mg po x , one hour before appointment with food. Make sure to eat a meal before appointment and drink plenty of fluids. Order placed for precert.  Cc: Theresia Lo  Routing to provider for final review. Patient agreeable to disposition. Will close encounter.   Patient aware provider will review message and nurse will return call if any additional advice or change of disposition.

## 2014-10-14 NOTE — Telephone Encounter (Signed)
Left message to call Kaitlyn at [phone]. 

## 2014-10-14 NOTE — Telephone Encounter (Signed)
Patient needs to have labs drawn. Future orders are already placed.  If they are drawn at least one day prior to her colposcopy, I can discuss them with her at that visit.   Thanks.

## 2014-10-20 ENCOUNTER — Ambulatory Visit: Payer: BLUE CROSS/BLUE SHIELD | Admitting: Family

## 2014-10-21 NOTE — Telephone Encounter (Addendum)
Call to patient to discuss having labs done day before colposcopy appointment to discuss with Dr.Silva at the time of the appointment. Patient states "I am going to have to cancel that appointment. I am unable to take any more days off of work. I have missed too many and will lose my job if I do. I also went to be seen for my elevated blood pressure and now I have a lot of appointments to get that under control." Patient declines to reschedule appointment at this time. States once she has her blood pressure under control and is able to have time off from work she will call to reschedule. Advised I will let Dr.Silva know. Patient is agreeable. 1 month recall placed.

## 2014-10-21 NOTE — Telephone Encounter (Addendum)
06 recall entered for cotesting pap and HR HPV.  Left message to call [facility] at [phone].

## 2014-10-21 NOTE — Telephone Encounter (Signed)
Please place patient in recall for 6 month cotesting - pap and HR HPV. Please call her back to make this appointment with me.  It is very important that she does follow up.

## 2014-11-04 ENCOUNTER — Ambulatory Visit: Payer: BLUE CROSS/BLUE SHIELD | Admitting: Obstetrics and Gynecology

## 2014-11-04 NOTE — Telephone Encounter (Signed)
Spoke with patient. Colposcopy rescheduled for 04/29/2015 at 3pm with Dr.Silva. Patient is agreeable to date and time. States this will give her enough time to "gain points back" at work.  Instructions given. Motrin 800 mg po x , one hour before appointment with food. Make sure to eat a meal before appointment and drink plenty of fluids. Patient is in 6 month recall. Will call to move appointment up if she will be able to with her work schedule.   Routing to provider for final review. Patient agreeable to disposition. Will close encounter.   Patient aware provider will review message and nurse will return call if any additional advice or change of disposition.

## 2014-11-05 ENCOUNTER — Ambulatory Visit: Payer: BLUE CROSS/BLUE SHIELD | Admitting: Obstetrics and Gynecology

## 2014-11-05 NOTE — Telephone Encounter (Signed)
I would recommend doing cotesting in 6 months and not a colposcopy.  Please make this change and make the patient aware.  Keep in 6 month recall.

## 2014-11-05 NOTE — Telephone Encounter (Signed)
Left message to call Elveta Rape at [phone]. 

## 2014-11-12 NOTE — Telephone Encounter (Addendum)
Spoke with patient. Advised of message as seen below from [place]. Patient is agreeable and verbalizes understanding. Appointment changed from colposcopy to office visit for cotesting. Remains on 04/29/2015 at 3 pm with Dr.Silva. Patient is in six month recall.  Routing to provider for final review. Patient agreeable to disposition. Will close encounter.

## 2015-04-28 ENCOUNTER — Telehealth: Payer: Self-pay | Admitting: Obstetrics and Gynecology

## 2015-04-28 NOTE — Telephone Encounter (Signed)
Please place patient in pap recall for February 2017.  She did not complete her evaluation for her normal pap and positive HR HPV.  She declined colposcopy.  She did not have subtyping of the HR HPV because a recommendation was made for colposcopy.  She was to come in for a 6 month recheck with cotesting. If no return to care by the end of this month, she will need a thirty day letter.  Cc- Abelino Derrick, Thora Lance

## 2015-04-28 NOTE — Telephone Encounter (Signed)
Patient canceled her appointment for "6 month cotesting/needs labs " 04/29/15. Patient to call later to reschedule.

## 2015-04-29 ENCOUNTER — Ambulatory Visit: Payer: BLUE CROSS/BLUE SHIELD | Admitting: Obstetrics and Gynecology

## 2015-04-29 NOTE — Telephone Encounter (Signed)
Patient is in recall. Will hold for appointment.  Current encounter closed.

## 2015-05-27 ENCOUNTER — Ambulatory Visit: Payer: BLUE CROSS/BLUE SHIELD | Admitting: Certified Nurse Midwife

## 2015-05-27 ENCOUNTER — Ambulatory Visit (INDEPENDENT_AMBULATORY_CARE_PROVIDER_SITE_OTHER): Payer: BLUE CROSS/BLUE SHIELD | Admitting: Obstetrics and Gynecology

## 2015-05-27 ENCOUNTER — Encounter: Payer: Self-pay | Admitting: Obstetrics and Gynecology

## 2015-05-27 VITALS — BP 138/94 | HR 64 | Ht 62.0 in | Wt 191.0 lb

## 2015-05-27 DIAGNOSIS — N760 Acute vaginitis: Secondary | ICD-10-CM | POA: Diagnosis not present

## 2015-05-27 DIAGNOSIS — R87810 Cervical high risk human papillomavirus (HPV) DNA test positive: Secondary | ICD-10-CM | POA: Diagnosis not present

## 2015-05-27 NOTE — Progress Notes (Signed)
Patient ID: Julia Horton, female   DOB: [birth year], 55 y.o.   MRN: [id number] GYNECOLOGY  VISIT   HPI: 55 y.o.   Single  African American  female   [phone] with Patient's last menstrual period was 08/24/2013 (approximate).   here for discharge and follow up pap smear. Has difficulty making appointments due to her work schedule and the limited time she can have off.  Having vaginal drainage off and on.  Some odor.  No itching.  Did douche.   Sexually active.  Same partner.  Reports vaginal dryness.Marland Kitchen  GYNECOLOGIC HISTORY: Patient's last menstrual period was 08/24/2013 (approximate). Contraception:post menopausal Menopausal hormone therapy: none Last mammogram: MMG: 5-10- 10 possible mass Lt.breast/Rt.breast no masses. Additional views and Diag.left mammogram 08-27-08 showing fibroglandular parenchymal pattern that is symmetric/neg:[facility] Last pap smear: 10/01/14, Negative with pos HR HPV, did not have colpo        OB History    Gravida Para Term Preterm AB TAB SAB Ectopic Multiple Living   3 1 1  2  2   1          There are no active problems to display for this patient.   Past Medical History  Diagnosis Date  . Hypertension   . Fibroid     Past Surgical History  Procedure Laterality Date  . Cesarean section  1989  . Breast surgery  1985    benign left breast biopsy    No current outpatient prescriptions on file.   No current facility-administered medications for this visit.     ALLERGIES: Review of patient's allergies indicates no known allergies.  Family History  Problem Relation Age of Onset  . Other Father 7    dec from gunshot wound  . Hypertension Father   . Migraines Mother   . Stroke Mother     TIA  . Other Brother     MVA  . Cancer Brother     Throat Ca    Social History   Social History  . Marital Status: Single    Spouse Name: N/A  . Number of Children: N/A  . Years of Education: N/A   Occupational History  . Not on file.    Social History Main Topics  . Smoking status: Light Tobacco Smoker    Types: Cigarettes  . Smokeless tobacco: Not on file     Comment: Only smokes socially when out with friends  . Alcohol Use: 2.4 oz/week    4 Standard drinks or equivalent per week  . Drug Use: No  . Sexual Activity:    Partners: Male    Birth Control/ Protection: Post-menopausal   Other Topics Concern  . Not on file   Social History Narrative    ROS:  Pertinent items are noted in HPI.  PHYSICAL EXAMINATION:    BP 138/94 mmHg  Pulse 64  Ht 5\' 2"  (1.575 m)  Wt 191 lb (86.637 kg)  BMI 34.93 kg/m2  LMP 08/24/2013 (Approximate)    General appearance: alert, cooperative and appears stated age   Pelvic: External genitalia:  no lesions              Urethra:  normal appearing urethra with no masses, tenderness or lesions              Bartholins and Skenes: normal                 Vagina: normal appearing vagina with normal color and discharge, no lesions  Cervix: no lesions              Pap taken: Yes.   Bimanual Exam:  Uterus:  normal size, contour, position, consistency, mobility, non-tender              Adnexa: normal adnexa and no mass, fullness, tenderness                 Chaperone was present for exam.  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 Vaginitis. Atrophy of vagina.  Overdue for mammogram.   PLAN  Counseled regarding abnormal paps and human papilloma virus.  Pap and HR HPV performed today.  Affirm done.  Advised against douching.  Use water based lubricants or cooking oils for vaginal dryness. Phone number given to patient for the [facility].  She will call to schedule her appointment.  An After Visit Summary was printed and given to the patient.  ___15___ minutes face to face time of which over 50% was spent in counseling.

## 2015-05-28 LAB — WET PREP BY MOLECULAR PROBE
CANDIDA SPECIES: NEGATIVE
Gardnerella vaginalis: POSITIVE — AB
TRICHOMONAS VAG: NEGATIVE

## 2015-05-30 ENCOUNTER — Telehealth: Payer: Self-pay

## 2015-05-30 ENCOUNTER — Ambulatory Visit: Payer: BLUE CROSS/BLUE SHIELD | Admitting: Obstetrics and Gynecology

## 2015-05-30 NOTE — Telephone Encounter (Signed)
Left message to call Ozzie Knobel at [phone]. 

## 2015-05-30 NOTE — Telephone Encounter (Signed)
-----   Message from Nunzio Cobbs, MD sent at 05/29/2015  8:31 AM EST ----- Please report results of Affirm showing bacterial vaginosis. She may treat with either Flagyl 500 mg po bid for 7 days or Metrogel pv at hs for 5 nights.  ETOH precautions.   Please also let her know I recommend she follow up with her PCP regarding her elevated blood pressure.  It was elevated at her office visit on 05/27/15.  Thank you.   Cc- Marisa Sprinkles

## 2015-05-31 MED ORDER — METRONIDAZOLE 500 MG PO TABS: 500.0000 mg | ORAL_TABLET | Freq: Two times a day (BID) | ORAL | Status: DC

## 2015-05-31 NOTE — Telephone Encounter (Signed)
Spoke with patient. Advised of results as seen below from [place]. She is agreeable and verbalizes understanding. Patient would like to use Flagyl for treatment at this time. ETOH precautions given. Rx for Flagyl 500 mg BID x 7 days #14 0RF sent to pharmacy on file. She is agreeable and verbalizes understanding. States she will contact her PCP today to schedule a follow up appointment for her elevated BP.  Routing to provider for final review. Patient agreeable to disposition. Will close encounter.

## 2015-06-01 LAB — IPS PAP TEST WITH HPV

## 2015-06-02 ENCOUNTER — Telehealth: Payer: Self-pay

## 2015-06-02 NOTE — Telephone Encounter (Signed)
Patient is returning a call to Amanda.

## 2015-06-02 NOTE — Telephone Encounter (Signed)
Called patient at [phone] to discuss pap results, left message on voicemail to call me back. 08 recall entered.

## 2015-06-03 NOTE — Telephone Encounter (Signed)
Patient returning your call.

## 2015-06-06 NOTE — Telephone Encounter (Signed)
Called patient at [phone] and per DPR left detailed message on voicemail stating normal pap and negative HR HPV.  Call if any questions. Next pap in 1 yr.

## 2015-06-06 NOTE — Telephone Encounter (Signed)
Patient returned call. Advised of results as seen below from [place]. She is agreeable and verbalizes understanding. 08 recall previously placed by Marisa Sprinkles, CMA.  Notes Recorded by Lowella Fairy, CMA on 06/06/2015 at 11:05 AM Called patient at [phone] and per DPR left detailed message with normal pap and negative HR HPV results. Advised to call if any questions. ------  Notes Recorded by Lowella Fairy, CMA on 06/02/2015 at 11:34 AM Called patient at [phone] to discuss pap results, left message on voicemail to call me back. 08 recall entered. ------  Notes Recorded by Nunzio Cobbs, MD on 06/02/2015 at 6:53 AM Please inform patient of normal pap and negative HR HPV.  Prior status was positive HR HPV, so this was a pap follow up.   BV already treated with Flagyl.  Recall - 08 due to prior positive HR HPV.  Encounter previously closed. Routing to Dr.Silva as FYI.

## 2015-06-17 ENCOUNTER — Other Ambulatory Visit: Payer: Self-pay | Admitting: Obstetrics and Gynecology

## 2015-06-17 NOTE — Telephone Encounter (Signed)
Medication refill request: Flagyl 500 MG  Last AEX:  10/01/2014 with BS  Next AEX: 10/14/2015 with BS Last MMG (if hormonal medication request): n/a Refill authorized: Please advise  Called and s/w patient she took 4 days worth of pills, consumed etoh and misplaced her pills. Patient is wanting to know if Dr. Quincy Simmonds could send in the remainder pills she needs to take.

## 2015-10-14 ENCOUNTER — Ambulatory Visit: Payer: BLUE CROSS/BLUE SHIELD | Admitting: Obstetrics and Gynecology

## 2015-10-20 ENCOUNTER — Ambulatory Visit (INDEPENDENT_AMBULATORY_CARE_PROVIDER_SITE_OTHER): Payer: BLUE CROSS/BLUE SHIELD | Admitting: Obstetrics and Gynecology

## 2015-10-20 ENCOUNTER — Encounter: Payer: Self-pay | Admitting: Obstetrics and Gynecology

## 2015-10-20 VITALS — BP 128/88 | HR 76 | Resp 14 | Ht 61.5 in | Wt 194.2 lb

## 2015-10-20 DIAGNOSIS — N760 Acute vaginitis: Secondary | ICD-10-CM | POA: Diagnosis not present

## 2015-10-20 DIAGNOSIS — Z0000 Encounter for general adult medical examination without abnormal findings: Secondary | ICD-10-CM

## 2015-10-20 DIAGNOSIS — Z01419 Encounter for gynecological examination (general) (routine) without abnormal findings: Secondary | ICD-10-CM | POA: Diagnosis not present

## 2015-10-20 DIAGNOSIS — Z1231 Encounter for screening mammogram for malignant neoplasm of breast: Secondary | ICD-10-CM | POA: Diagnosis not present

## 2015-10-20 LAB — POCT URINALYSIS DIPSTICK
BILIRUBIN UA: NEGATIVE
Glucose, UA: NEGATIVE
Ketones, UA: NEGATIVE
Leukocytes, UA: NEGATIVE
Nitrite, UA: NEGATIVE
PH UA: 5
Protein, UA: NEGATIVE
RBC UA: NEGATIVE
Urobilinogen, UA: NEGATIVE

## 2015-10-20 NOTE — Progress Notes (Signed)
55 y.o. [id number] Single African American female here for annual exam.    Lost her Rx for bacterial vaginosis.  Still a little bit of odor and itching in vagina.   Now on increased blood pressure medication.   Had negative STD testing 1 - 2 years ago.  Declines this today.  PCP:  Sandi Mariscal, MD  Patient's last menstrual period was 08/24/2013 (approximate).           Sexually active: Yes.   female The current method of family planning is post menopausal status.    Exercising: Yes.    Walks daily Smoker:  Yes, smokes socially or when she drinks  Health Maintenance: Pap:  05/30/15 pap and HR HPV negative.  Pap 10/01/14 - negative, positive HR HPV. History of abnormal Pap:  yes MMG:  5-10- 10 possible mass Lt.breast/Rt.breast no masses. Additional views and Diag.left mammogram 08-27-08 showing fibroglandular parenchymal pattern that is symmetric/neg:[facility] Colonoscopy:  never BMD:   n/a  Result  n/a TDaP:  Unsure.  Will ask PCP. HIV:  Negative one year ago.  [facility] Hep C:  Negative one year ago.  [facility] Screening Labs:  Hb today: PCP, Urine today: Neg   reports that she has been smoking Cigarettes.  She has never used smokeless tobacco. She reports that she drinks about 1.8 oz of alcohol per week . She reports that she does not use drugs.  Past Medical History:  Diagnosis Date  . Fibroid   . Hypertension     Past Surgical History:  Procedure Laterality Date  . BREAST SURGERY  1985   benign left breast biopsy  . CESAREAN SECTION  1989    Current Outpatient Prescriptions  Medication Sig Dispense Refill  . Enalapril-Hydrochlorothiazide 5-12.5 MG tablet Take 1 tablet by mouth 2 (two) times daily.     No current facility-administered medications for this visit.     Family History  Problem Relation Age of Onset  . Other Father 47    dec from gunshot wound  . Hypertension Father   . Migraines Mother   . Stroke Mother     TIA  . Other Brother     MVA  .  Cancer Brother     Throat Ca    ROS:  Pertinent items are noted in HPI.  Otherwise, a comprehensive ROS was negative.  Exam:   BP 128/88 (BP Location: Right Arm, Patient Position: Sitting, Cuff Size: Normal)   Pulse 76   Resp 14   Ht 5' 1.5" (1.562 m)   Wt 194 lb 3.2 oz (88.1 kg)   LMP 08/24/2013 (Approximate)   BMI 36.10 kg/m     General appearance: alert, cooperative and appears stated age Head: Normocephalic, without obvious abnormality, atraumatic Neck: no adenopathy, supple, symmetrical, trachea midline and thyroid normal to inspection and palpation Lungs: clear to auscultation bilaterally Breasts:  No dominant masses, retractions, skin retractions or nipple discharge. Heart: regular rate and rhythm Abdomen: incisions:   soft, non-tender; no masses, no organomegaly Extremities: extremities normal, atraumatic, no cyanosis or edema Skin: Skin color, texture, turgor normal. No rashes or lesions Lymph nodes: Cervical, supraclavicular, and axillary nodes normal. No abnormal inguinal nodes palpated Neurologic: Grossly normal  Pelvic: External genitalia:  no lesions              Urethra:  normal appearing urethra with no masses, tenderness or lesions              Bartholins and Skenes:  normal                 Vagina: normal appearing vagina with normal color and discharge, no lesions              Cervix: no lesions              Pap taken: No. Bimanual Exam:  Uterus:  normal size, contour, position, consistency, mobility, non-tender              Adnexa: normal adnexa and no mass, fullness, tenderness              Rectal exam: Yes.  .  Confirms.              Anus:  normal sphincter tone, no lesions  Chaperone was present for exam.  Assessment:   Well woman visit with normal exam. Recent BV untreated.  Vaginitis today. Hx of positive HR HPV.  Plan: Yearly mammogram recommended after age 40.   We will schedule for patient. Recommended self breast exam.  Pap and HR HPV as  above.  Will need next year. Discussed Calcium, Vitamin D, regular exercise program including cardiovascular and weight bearing exercise. Labs performed.  No..   See orders. Prescription medication(s) given.  No..   Colonoscopy recommended.  Follow up annually and prn.    .    After visit summary provided.

## 2015-10-20 NOTE — Patient Instructions (Signed)
Health Maintenance, Female Adopting a healthy lifestyle and getting preventive care can go a long way to promote health and wellness. Talk with your health care provider about what schedule of regular examinations is right for you. This is a good chance for you to check in with your provider about disease prevention and staying healthy. In between checkups, there are plenty of things you can do on your own. Experts have done a lot of research about which lifestyle changes and preventive measures are most likely to keep you healthy. Ask your health care provider for more information. WEIGHT AND DIET  Eat a healthy diet  Be sure to include plenty of vegetables, fruits, low-fat dairy products, and lean protein.  Do not eat a lot of foods high in solid fats, added sugars, or salt.  Get regular exercise. This is one of the most important things you can do for your health.  Most adults should exercise for at least 150 minutes each week. The exercise should increase your heart rate and make you sweat (moderate-intensity exercise).  Most adults should also do strengthening exercises at least twice a week. This is in addition to the moderate-intensity exercise.  Maintain a healthy weight  Body mass index (BMI) is a measurement that can be used to identify possible weight problems. It estimates body fat based on height and weight. Your health care provider can help determine your BMI and help you achieve or maintain a healthy weight.  For females 20 years of age and older:   A BMI below 18.5 is considered underweight.  A BMI of 18.5 to 24.9 is normal.  A BMI of 25 to 29.9 is considered overweight.  A BMI of 30 and above is considered obese.  Watch levels of cholesterol and blood lipids  You should start having your blood tested for lipids and cholesterol at 53 years of age, then have this test every 5 years.  You may need to have your cholesterol levels checked more often if:  Your lipid  or cholesterol levels are high.  You are older than 53 years of age.  You are at high risk for heart disease.  CANCER SCREENING   Lung Cancer  Lung cancer screening is recommended for adults 55-80 years old who are at high risk for lung cancer because of a history of smoking.  A yearly low-dose CT scan of the lungs is recommended for people who:  Currently smoke.  Have quit within the past 15 years.  Have at least a 30-pack-year history of smoking. A pack year is smoking an average of one pack of cigarettes a day for 1 year.  Yearly screening should continue until it has been 15 years since you quit.  Yearly screening should stop if you develop a health problem that would prevent you from having lung cancer treatment.  Breast Cancer  Practice breast self-awareness. This means understanding how your breasts normally appear and feel.  It also means doing regular breast self-exams. Let your health care provider know about any changes, no matter how small.  If you are in your 20s or 30s, you should have a clinical breast exam (CBE) by a health care provider every 1-3 years as part of a regular health exam.  If you are 40 or older, have a CBE every year. Also consider having a breast X-ray (mammogram) every year.  If you have a family history of breast cancer, talk to your health care provider about genetic screening.  If you   are at high risk for breast cancer, talk to your health care provider about having an MRI and a mammogram every year.  Breast cancer gene (BRCA) assessment is recommended for women who have family members with BRCA-related cancers. BRCA-related cancers include:  Breast.  Ovarian.  Tubal.  Peritoneal cancers.  Results of the assessment will determine the need for genetic counseling and BRCA1 and BRCA2 testing. Cervical Cancer Your health care provider may recommend that you be screened regularly for cancer of the pelvic organs (ovaries, uterus, and  vagina). This screening involves a pelvic examination, including checking for microscopic changes to the surface of your cervix (Pap test). You may be encouraged to have this screening done every 3 years, beginning at age 21.  For women ages 30-65, health care providers may recommend pelvic exams and Pap testing every 3 years, or they may recommend the Pap and pelvic exam, combined with testing for human papilloma virus (HPV), every 5 years. Some types of HPV increase your risk of cervical cancer. Testing for HPV may also be done on women of any age with unclear Pap test results.  Other health care providers may not recommend any screening for nonpregnant women who are considered low risk for pelvic cancer and who do not have symptoms. Ask your health care provider if a screening pelvic exam is right for you.  If you have had past treatment for cervical cancer or a condition that could lead to cancer, you need Pap tests and screening for cancer for at least 20 years after your treatment. If Pap tests have been discontinued, your risk factors (such as having a new sexual partner) need to be reassessed to determine if screening should resume. Some women have medical problems that increase the chance of getting cervical cancer. In these cases, your health care provider may recommend more frequent screening and Pap tests. Colorectal Cancer  This type of cancer can be detected and often prevented.  Routine colorectal cancer screening usually begins at 53 years of age and continues through 53 years of age.  Your health care provider may recommend screening at an earlier age if you have risk factors for colon cancer.  Your health care provider may also recommend using home test kits to check for hidden blood in the stool.  A small camera at the end of a tube can be used to examine your colon directly (sigmoidoscopy or colonoscopy). This is done to check for the earliest forms of colorectal  cancer.  Routine screening usually begins at age 50.  Direct examination of the colon should be repeated every 5-10 years through 53 years of age. However, you may need to be screened more often if early forms of precancerous polyps or small growths are found. Skin Cancer  Check your skin from head to toe regularly.  Tell your health care provider about any new moles or changes in moles, especially if there is a change in a mole's shape or color.  Also tell your health care provider if you have a mole that is larger than the size of a pencil eraser.  Always use sunscreen. Apply sunscreen liberally and repeatedly throughout the day.  Protect yourself by wearing long sleeves, pants, a wide-brimmed hat, and sunglasses whenever you are outside. HEART DISEASE, DIABETES, AND HIGH BLOOD PRESSURE   High blood pressure causes heart disease and increases the risk of stroke. High blood pressure is more likely to develop in:  People who have blood pressure in the high end   of the normal range (130-139/85-89 mm Hg).  People who are overweight or obese.  People who are African American.  If you are 38-23 years of age, have your blood pressure checked every 3-5 years. If you are 61 years of age or older, have your blood pressure checked every year. You should have your blood pressure measured twice--once when you are at a hospital or clinic, and once when you are not at a hospital or clinic. Record the average of the two measurements. To check your blood pressure when you are not at a hospital or clinic, you can use:  An automated blood pressure machine at a pharmacy.  A home blood pressure monitor.  If you are between 45 years and 39 years old, ask your health care provider if you should take aspirin to prevent strokes.  Have regular diabetes screenings. This involves taking a blood sample to check your fasting blood sugar level.  If you are at a normal weight and have a low risk for diabetes,  have this test once every three years after 53 years of age.  If you are overweight and have a high risk for diabetes, consider being tested at a younger age or more often. PREVENTING INFECTION  Hepatitis B  If you have a higher risk for hepatitis B, you should be screened for this virus. You are considered at high risk for hepatitis B if:  You were born in a country where hepatitis B is common. Ask your health care provider which countries are considered high risk.  Your parents were born in a high-risk country, and you have not been immunized against hepatitis B (hepatitis B vaccine).  You have HIV or AIDS.  You use needles to inject street drugs.  You live with someone who has hepatitis B.  You have had sex with someone who has hepatitis B.  You get hemodialysis treatment.  You take certain medicines for conditions, including cancer, organ transplantation, and autoimmune conditions. Hepatitis C  Blood testing is recommended for:  Everyone born from [birth year] through 1963.  Anyone with known risk factors for hepatitis C. Sexually transmitted infections (STIs)  You should be screened for sexually transmitted infections (STIs) including gonorrhea and chlamydia if:  You are sexually active and are younger than 53 years of age.  You are older than 53 years of age and your health care provider tells you that you are at risk for this type of infection.  Your sexual activity has changed since you were last screened and you are at an increased risk for chlamydia or gonorrhea. Ask your health care provider if you are at risk.  If you do not have HIV, but are at risk, it may be recommended that you take a prescription medicine daily to prevent HIV infection. This is called pre-exposure prophylaxis (PrEP). You are considered at risk if:  You are sexually active and do not regularly use condoms or know the HIV status of your partner(s).  You take drugs by injection.  You are sexually  active with a partner who has HIV. Talk with your health care provider about whether you are at high risk of being infected with HIV. If you choose to begin PrEP, you should first be tested for HIV. You should then be tested every 3 months for as long as you are taking PrEP.  PREGNANCY   If you are premenopausal and you may become pregnant, ask your health care provider about preconception counseling.  If you may  become pregnant, take 400 to 800 micrograms (mcg) of folic acid every day.  If you want to prevent pregnancy, talk to your health care provider about birth control (contraception). OSTEOPOROSIS AND MENOPAUSE   Osteoporosis is a disease in which the bones lose minerals and strength with aging. This can result in serious bone fractures. Your risk for osteoporosis can be identified using a bone density scan.  If you are 61 years of age or older, or if you are at risk for osteoporosis and fractures, ask your health care provider if you should be screened.  Ask your health care provider whether you should take a calcium or vitamin D supplement to lower your risk for osteoporosis.  Menopause may have certain physical symptoms and risks.  Hormone replacement therapy may reduce some of these symptoms and risks. Talk to your health care provider about whether hormone replacement therapy is right for you.  HOME CARE INSTRUCTIONS   Schedule regular health, dental, and eye exams.  Stay current with your immunizations.   Do not use any tobacco products including cigarettes, chewing tobacco, or electronic cigarettes.  If you are pregnant, do not drink alcohol.  If you are breastfeeding, limit how much and how often you drink alcohol.  Limit alcohol intake to no more than 1 drink per day for nonpregnant women. One drink equals 12 ounces of beer, 5 ounces of wine, or 1 ounces of hard liquor.  Do not use street drugs.  Do not share needles.  Ask your health care provider for help if  you need support or information about quitting drugs.  Tell your health care provider if you often feel depressed.  Tell your health care provider if you have ever been abused or do not feel safe at home.   This information is not intended to replace advice given to you by your health care provider. Make sure you discuss any questions you have with your health care provider.   Document Released: 09/25/2010 Document Revised: 04/02/2014 Document Reviewed: 02/11/2013 Elsevier Interactive Patient Education [employer].

## 2015-10-20 NOTE — Progress Notes (Signed)
Scheduled patient for [facility] on 12[facility]02[facility]2015 at 10:40 am. Patient is agreeable to date and time.

## 2015-10-21 ENCOUNTER — Telehealth: Payer: Self-pay

## 2015-10-21 LAB — WET PREP BY MOLECULAR PROBE
CANDIDA SPECIES: NEGATIVE
Gardnerella vaginalis: POSITIVE — AB
TRICHOMONAS VAG: NEGATIVE

## 2015-10-21 MED ORDER — METRONIDAZOLE 500 MG PO TABS: 500.0000 mg | ORAL_TABLET | Freq: Two times a day (BID) | ORAL | 0 refills | Status: DC

## 2015-10-21 NOTE — Telephone Encounter (Signed)
-----   Message from Nunzio Cobbs, MD sent at 10/21/2015  5:17 AM EDT ----- Please inform patient of results showing bacterial vaginosis.  She may take Flagyl 500 mg po bid for 7 days.  ETOH precautions.   Cc - Marisa Sprinkles

## 2015-10-21 NOTE — Telephone Encounter (Signed)
Spoke with patient. Advised of message as seen below from [place]. Patient is agreeable and verbalizes understanding. Rx for Flagyl 500 mg bid x 7 days #14 0RF sent to pharmacy on file. ETOH precautions given.  Routing to provider for final review. Patient agreeable to disposition. Will close encounter.

## 2015-12-02 ENCOUNTER — Ambulatory Visit: Payer: BLUE CROSS/BLUE SHIELD

## 2016-01-06 DIAGNOSIS — A64 Unspecified sexually transmitted disease: Secondary | ICD-10-CM

## 2016-01-06 HISTORY — DX: Unspecified sexually transmitted disease: A64

## 2016-01-20 ENCOUNTER — Telehealth: Payer: Self-pay | Admitting: Obstetrics and Gynecology

## 2016-01-20 NOTE — Telephone Encounter (Signed)
Spoke with patient. Attempted to triage patient. Patient abrupt and not willing to provide any detailed information. 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 Patient states she has been having vaginal discharge for 1 week; no pain, burning or itching. States no intercourse in 3 weeks. Patient states she wants an appt with Dr. Quincy Simmonds Monday morning and if Dr. Quincy Simmonds not available, another provider. Advised patient Dr. Quincy Simmonds in Surgery Monday, 01/23/16, morning can scheduled with another provider. Patient scheduled with Kem Boroughs, NP 01/23/16 at 10:15am. Last AEX 10/20/15. Patient is agreeable to date and time.   Routing to provider for final review. Patient is agreeable to disposition. Will close encounter.   Cc: Kem Boroughs, NP

## 2016-01-20 NOTE — Telephone Encounter (Signed)
Patient wants to speak with a nurse about discharge she is experiencing.

## 2016-01-23 ENCOUNTER — Encounter: Payer: Self-pay | Admitting: Nurse Practitioner

## 2016-01-23 ENCOUNTER — Ambulatory Visit (INDEPENDENT_AMBULATORY_CARE_PROVIDER_SITE_OTHER): Payer: BLUE CROSS/BLUE SHIELD | Admitting: Nurse Practitioner

## 2016-01-23 VITALS — BP 162/100 | HR 76 | Resp 14 | Wt 193.0 lb

## 2016-01-23 DIAGNOSIS — R350 Frequency of micturition: Secondary | ICD-10-CM

## 2016-01-23 DIAGNOSIS — N760 Acute vaginitis: Secondary | ICD-10-CM

## 2016-01-23 LAB — POCT URINALYSIS DIPSTICK
BILIRUBIN UA: NEGATIVE
Blood, UA: NEGATIVE
GLUCOSE UA: NEGATIVE
Ketones, UA: NEGATIVE
Leukocytes, UA: NEGATIVE
Nitrite, UA: NEGATIVE
Protein, UA: NEGATIVE
UROBILINOGEN UA: NEGATIVE
pH, UA: 6.5

## 2016-01-23 NOTE — Progress Notes (Signed)
Patient ID: Julia Horton, female   DOB: [birth year], 55 y.o.   MRN: [id number]  55 y.o. Single African American female [phone] here with complaint of vaginal symptoms of  increase discharge with odor. Describes discharge as clear.  She thinks she has BV again. Onset of symptoms 2 weeks ago. Denies new personal products or vaginal dryness. She does have STD concerns as she has a new partner. Urinary symptoms - increased frequency without dysuria. She relates this to increase in beer consumption recently.  Contraception is postmenopausal.  Not using condoms.   LMP: 08/24/2013.   O:  Healthy female WDWN Affect: normal, orientation x 3  Exam: no distress Abdomen: soft and non tender Lymph node: no enlargement or tenderness Pelvic exam: External genital: normal female BUS: negative Vagina: clear discharge noted.  Affirm taken. Cervix: normal, non tender, no CMT    A: Vaginitis -with history of BV  R/O GC/ Chl    P: Discussed findings of vaginitis and etiology. Discussed Aveeno or baking soda sitz bath for comfort. Avoid moist clothes or pads for extended period of time. If working out in gym clothes or swim suits for long periods of time change underwear or bottoms of swimsuit if possible. Olive Oil/Coconut Oil use for skin protection prior to activity can be used to external skin.  Rx: hold awaiting test results - prefers Flagyl po  Advise condom use  Follow with Affirm, GC, Chl  RV prn

## 2016-01-23 NOTE — Patient Instructions (Signed)

## 2016-01-24 ENCOUNTER — Other Ambulatory Visit: Payer: Self-pay | Admitting: Nurse Practitioner

## 2016-01-24 LAB — GC/CHLAMYDIA PROBE AMP
CT PROBE, AMP APTIMA: DETECTED — AB
GC Probe RNA: NOT DETECTED

## 2016-01-24 LAB — WET PREP BY MOLECULAR PROBE
CANDIDA SPECIES: NEGATIVE
GARDNERELLA VAGINALIS: POSITIVE — AB
Trichomonas vaginosis: NEGATIVE

## 2016-01-24 MED ORDER — METRONIDAZOLE 500 MG PO TABS: 500.0000 mg | ORAL_TABLET | Freq: Two times a day (BID) | ORAL | 0 refills | Status: DC

## 2016-01-24 NOTE — Progress Notes (Signed)
Encounter reviewed by Dr. Brook Amundson C. Silva.  

## 2016-01-25 ENCOUNTER — Telehealth: Payer: Self-pay | Admitting: *Deleted

## 2016-01-25 ENCOUNTER — Other Ambulatory Visit: Payer: Self-pay | Admitting: Nurse Practitioner

## 2016-01-25 MED ORDER — AZITHROMYCIN 1 G PO PACK: 1.0000 g | PACK | Freq: Once | ORAL | 0 refills | Status: AC

## 2016-01-25 NOTE — Telephone Encounter (Signed)
OK to close encounter. 

## 2016-01-25 NOTE — Telephone Encounter (Signed)
Left message to call Julia Horton at [phone].  

## 2016-01-25 NOTE — Telephone Encounter (Signed)
-----   Message from Kem Boroughs, [place] sent at 01/24/2016  5:30 PM EDT ----- Please let pt know that chlamydia test was positive.  She will need to inform partner (s).  Health department card needs completion.  Treatment with Zithromax and TOC scheduled.  The GC was negative.  This is a new partner for her.  We did not do the rest of STD panel but now with this result lets have her to get all STD's at time of TOC or can come in now for HIV, Hep B, STS.

## 2016-01-25 NOTE — Progress Notes (Signed)
See previous telephone encounter opened for 01/25/16. Will close this encounter.

## 2016-01-25 NOTE — Telephone Encounter (Signed)
Spoke with patient. Advised of results as seen below per Darrold Span, NP. Patient states she is at work will need to return call to set up 3 month TOC appt. Patient states she will plan to do further STD testing at that appt. Patient verbalizes understanding and is agreeable.

## 2016-01-25 NOTE — Telephone Encounter (Signed)
DHHS 2124 Form completed and faxed to [facility] at [facility].   Kem Boroughs, NP ok to close encounter?

## 2016-04-18 ENCOUNTER — Ambulatory Visit (INDEPENDENT_AMBULATORY_CARE_PROVIDER_SITE_OTHER): Payer: BLUE CROSS/BLUE SHIELD | Admitting: Podiatry

## 2016-04-18 ENCOUNTER — Ambulatory Visit (INDEPENDENT_AMBULATORY_CARE_PROVIDER_SITE_OTHER): Payer: BLUE CROSS/BLUE SHIELD

## 2016-04-18 VITALS — BP 169/102

## 2016-04-18 DIAGNOSIS — M79672 Pain in left foot: Secondary | ICD-10-CM

## 2016-04-18 DIAGNOSIS — M779 Enthesopathy, unspecified: Secondary | ICD-10-CM | POA: Diagnosis not present

## 2016-04-18 DIAGNOSIS — M25572 Pain in left ankle and joints of left foot: Secondary | ICD-10-CM

## 2016-04-18 DIAGNOSIS — M7752 Other enthesopathy of left foot: Secondary | ICD-10-CM | POA: Diagnosis not present

## 2016-04-18 MED ORDER — TRIAMCINOLONE ACETONIDE 10 MG/ML IJ SUSP
10.0000 mg | Freq: Once | INTRAMUSCULAR | Status: AC
  Administered 2016-04-18: 10 mg

## 2016-04-18 MED ORDER — DICLOFENAC SODIUM 75 MG PO TBEC: 75.0000 mg | DELAYED_RELEASE_TABLET | Freq: Two times a day (BID) | ORAL | 2 refills | Status: DC

## 2016-04-18 NOTE — Progress Notes (Signed)
Subjective:     Patient ID: Julia Horton, female   DOB: 1960/10/30, 56 y.o.   MRN: [id number]  HPI patient presents stating she's getting a lot of pain in the outside of the left ankle with inflammation fluid and does not remember specific injury   Review of Systems  All other systems reviewed and are negative.      Objective:   Physical Exam  Constitutional: She is oriented to person, place, and time.  Cardiovascular: Intact distal pulses.   Musculoskeletal: Normal range of motion.  Neurological: She is oriented to person, place, and time.  Skin: Skin is warm.  Nursing note and vitals reviewed.  neurovascular status intact muscle strength adequate range of motion subtalar midtarsal joint within normal limits with patient found to have quite a bit of discomfort in the peroneal group as it comes underneath the lateral malleolus. There is no indication of tendon dysfunction or tear     Assessment:     Peroneal tendinitis left with inflammation    Plan:     H&P x-rays reviewed and careful sheath injection administered 3 mg Kenalog 5 mg Xylocaine and advised on physical therapy and dispensed fascial brace with instructions on usage  Multiple views left ankle negative for signs of fracture or arthritis or diastases injury

## 2016-04-18 NOTE — Patient Instructions (Signed)

## 2016-04-23 ENCOUNTER — Ambulatory Visit (INDEPENDENT_AMBULATORY_CARE_PROVIDER_SITE_OTHER): Payer: BLUE CROSS/BLUE SHIELD | Admitting: Nurse Practitioner

## 2016-04-23 ENCOUNTER — Encounter: Payer: Self-pay | Admitting: Nurse Practitioner

## 2016-04-23 VITALS — BP 132/84 | HR 72 | Ht 61.5 in | Wt 191.0 lb

## 2016-04-23 DIAGNOSIS — A749 Chlamydial infection, unspecified: Secondary | ICD-10-CM | POA: Diagnosis not present

## 2016-04-23 DIAGNOSIS — Z113 Encounter for screening for infections with a predominantly sexual mode of transmission: Secondary | ICD-10-CM | POA: Diagnosis not present

## 2016-04-23 NOTE — Patient Instructions (Addendum)
Chlamydia, Female Chlamydia is an infection. It is spread from one person to another person during sexual contact. This infection can be in the cervix, urine tube (urethra), throat, or bottom (rectum). This infection needs treatment. HOME CARE   Take your medicines (antibiotics) as told. Finish them even if you start to feel better.  Only take medicine as told by your doctor.  Tell your sex partner(s) that you have chlamydia. They must also be treated.  Do not have sex until your doctor says it is okay.  Rest.  Eat healthy. Drink enough fluids to keep your pee (urine) clear or pale yellow.  Keep all doctor visits as told. GET HELP IF:  You have pain when you pee.  You have belly pain.  You have vaginal discharge.  You have pain during sex.  You have bleeding between periods and after sex.  You have a fever. GET HELP RIGHT AWAY IF:   You feel sick to your stomach (nauseous) or you throw up (vomit).  You sweat much more than normal (diaphoresis).  You have trouble swallowing. This information is not intended to replace advice given to you by your health care provider. Make sure you discuss any questions you have with your health care provider. Document Released: 12/20/2007 Document Revised: 07/04/2015 Document Reviewed: 11/17/2012 Elsevier Interactive Patient Education  2017 [employer].

## 2016-04-23 NOTE — Progress Notes (Signed)
56 y.o. Single African American female [phone] here for follow up TOC for positive chlamydia that was diagnosed and treated on 01/23/16.  At that time had a new partner.   Treated with Zithromax initiated on 01/24/16. Completed all medication as directed.  New symptoms of increase vaginal discharge X 2 weeks.  No itching, burning, maybe slight odor.  No pelvic pain, fever / chills.  Partner states he got treatment in October (pt is not sure).  LMP: 08/24/2013.    O: Healthy WD,WN female Affect: normal - no distress  Abdomen:soft, non tender Pelvic exam:EXTERNAL GENITALIA: normal appearing vulva with no masses, tenderness or lesions VAGINA: discharge white to grey CERVIX: no lesions or cervical motion tenderness  A: R/O STD's  TOC for Chlamydia - diagnosed and treated 01/23/16    P:  Discussed that we will test for BV and yeast as well.   Labs : STD panel, GC/Chl, Affirm   RV

## 2016-04-24 LAB — STD PANEL
HIV: NONREACTIVE
Hepatitis B Surface Ag: NEGATIVE

## 2016-04-24 LAB — WET PREP BY MOLECULAR PROBE
Candida species: NEGATIVE
Gardnerella vaginalis: POSITIVE — AB
Trichomonas vaginosis: NEGATIVE

## 2016-04-25 ENCOUNTER — Other Ambulatory Visit: Payer: Self-pay | Admitting: Nurse Practitioner

## 2016-04-25 LAB — GC/CHLAMYDIA PROBE AMP
CT Probe RNA: NOT DETECTED
GC PROBE AMP APTIMA: NOT DETECTED

## 2016-04-25 MED ORDER — METRONIDAZOLE 0.75 % VA GEL: 1.0000 | Freq: Every day | VAGINAL | 0 refills | Status: DC

## 2016-04-25 NOTE — Progress Notes (Signed)
Encounter reviewed by Dr. Kerman Pfost Amundson C. Silva.  

## 2016-04-30 ENCOUNTER — Telehealth: Payer: Self-pay | Admitting: Nurse Practitioner

## 2016-04-30 MED ORDER — METRONIDAZOLE 500 MG PO TABS: 500.0000 mg | ORAL_TABLET | Freq: Two times a day (BID) | ORAL | 0 refills | Status: DC

## 2016-04-30 NOTE — Telephone Encounter (Signed)
Patient would like to get a prescription change because medication is expensive at the pharmacy.

## 2016-04-30 NOTE — Telephone Encounter (Signed)
Spoke with patient. Patient states Metrogel is too expensive, would like an alternative. Advised patient will change to Flagyl 500 mg po bid #14/0RF. Advised patient will cancel metrogel at pharmacy. Patient verbalizes understanding and is agreeable.  Spoke with Raquel Sarna at [facility]. Called to discontinue metrogel. Advised new order placed.   Routing to provider for final review. Patient is agreeable to disposition. Will close encounter.

## 2016-05-10 ENCOUNTER — Ambulatory Visit: Payer: BLUE CROSS/BLUE SHIELD | Admitting: Podiatry

## 2016-06-18 ENCOUNTER — Telehealth: Payer: Self-pay | Admitting: *Deleted

## 2016-06-18 NOTE — Telephone Encounter (Signed)
Patient in 08 recall for PAP. Patient is not due for AEX until 09/2016. She already has an appointment for 11-16-16. Does patient need to come in sooner for just a PAP or can it wait till her AEX?  PAP HX: 05/30/15 pap and HR HPV negative.  Pap 10/01/14 - negative, positive HR HPV  Please advise

## 2016-06-18 NOTE — Telephone Encounter (Signed)
Recall extended -eh 

## 2016-06-18 NOTE — Telephone Encounter (Signed)
[place]et [place] for pap 11/16/16 at annual exam.  Please place recall for August 2018.  Thank you!

## 2016-08-24 DIAGNOSIS — B009 Herpesviral infection, unspecified: Secondary | ICD-10-CM

## 2016-08-24 HISTORY — DX: Herpesviral infection, unspecified: B00.9

## 2016-09-06 ENCOUNTER — Telehealth: Payer: Self-pay | Admitting: Nurse Practitioner

## 2016-09-06 NOTE — Telephone Encounter (Signed)
Routing to covering provider Melvia Heaps, CNM for review.  Cc: Kem Boroughs, NP

## 2016-09-06 NOTE — Telephone Encounter (Signed)
FYI: patient canceled her appointment for odor due to work conflict.. She is now scheduled for Wed 09/12/16. Patient states she has to have 4 days in advance to get off from work.

## 2016-09-07 ENCOUNTER — Ambulatory Visit: Payer: BLUE CROSS/BLUE SHIELD | Admitting: Nurse Practitioner

## 2016-09-12 ENCOUNTER — Encounter: Payer: Self-pay | Admitting: Nurse Practitioner

## 2016-09-12 ENCOUNTER — Ambulatory Visit: Payer: BLUE CROSS/BLUE SHIELD | Admitting: Nurse Practitioner

## 2016-09-12 VITALS — BP 136/84 | HR 64 | Ht 61.5 in | Wt 187.0 lb

## 2016-09-12 DIAGNOSIS — N760 Acute vaginitis: Secondary | ICD-10-CM | POA: Diagnosis not present

## 2016-09-12 DIAGNOSIS — N9089 Other specified noninflammatory disorders of vulva and perineum: Secondary | ICD-10-CM

## 2016-09-12 DIAGNOSIS — Z113 Encounter for screening for infections with a predominantly sexual mode of transmission: Secondary | ICD-10-CM

## 2016-09-12 MED ORDER — VALACYCLOVIR HCL 1 G PO TABS
1000.0000 mg | ORAL_TABLET | Freq: Two times a day (BID) | ORAL | 0 refills | Status: DC
Start: 2016-09-12 — End: 2016-09-20

## 2016-09-12 NOTE — Patient Instructions (Signed)
Genital Herpes Genital herpes is a common sexually transmitted infection (STI) that is caused by a virus. The virus spreads from person to person through sexual contact. Infection can cause itching, blisters, and sores around the genitals or rectum. Symptoms may last several days and then go away This is called an outbreak. However, the virus remains in your body, so you may have more outbreaks in the future. The time between outbreaks varies and can be months or years. Genital herpes affects men and women. It is particularly concerning for pregnant women because the virus can be passed to the baby during delivery and can cause serious problems. Genital herpes is also a concern for people who have a weak disease-fighting (immune) system. What are the causes? This condition is caused by the herpes simplex virus (HSV) type 1 or type 2. The virus may spread through:  Sexual contact with an infected person, including vaginal, anal, and oral sex.  Contact with fluid from a herpes sore.  The skin. This means that you can get herpes from an infected partner even if he or she does not have a visible sore or does not know that he or she is infected. What increases the risk? You are more likely to develop this condition if:  You have sex with many partners.  You do not use latex condoms during sex. What are the signs or symptoms? Most people do not have symptoms (asymptomatic) or have mild symptoms that may be mistaken for other skin problems. Symptoms may include:  Small red bumps near the genitals, rectum, or mouth. These bumps turn into blisters and then turn into sores.  Flu-like symptoms, including:  Fever.  Body aches.  Swollen lymph nodes.  Headache.  Painful urination.  Pain and itching in the genital area or rectal area.  Vaginal discharge.  Tingling or shooting pain in the legs and buttocks. Generally, symptoms are more severe and last longer during the first (primary)  outbreak. Flu-like symptoms are also more common during the primary outbreak. How is this diagnosed? Genital herpes may be diagnosed based on:  A physical exam.  Your medical history.  Blood tests.  A test of a fluid sample (culture) from an open sore. How is this treated? There is no cure for this condition, but treatment with antiviral medicines that are taken by mouth (orally) can do the following:  Speed up healing and relieve symptoms.  Help to reduce the spread of the virus to sexual partners.  Limit the chance of future outbreaks, or make future outbreaks shorter.  Lessen symptoms of future outbreaks. Your health care provider may also recommend pain relief medicines, such as aspirin or ibuprofen. Follow these instructions at home: Sexual activity   Do not have sexual contact during active outbreaks.  Practice safe sex. Latex condoms and female condoms may help prevent the spread of the herpes virus. General instructions   Keep the affected areas dry and clean.  Take over-the-counter and prescription medicines only as told by your health care provider.  Avoid rubbing or touching blisters and sores. If you do touch blisters or sores:  Wash your hands thoroughly with soap and water.  Do not touch your eyes afterward.  To help relieve pain or itching, you may take the following actions as directed by your health care provider:  Apply a cold, wet cloth (cold compress) to affected areas 4-6 times a day.  Apply a substance that protects your skin and reduces bleeding (astringent).  Apply a   gel that helps relieve pain around sores (lidocaine gel).  Take a warm, shallow bath that cleans the genital area (sitz bath).  Keep all follow-up visits as told by your health care provider. This is important. How is this prevented?  Use condoms. Although anyone can get genital herpes during sexual contact, even with the use of a condom, a condom can provide some  protection.  Avoid having multiple sexual partners.  Talk with your sexual partner about any symptoms either of you may have. Also, talk with your partner about any history of STIs.  Get tested for STIs before you have sex. Ask your partner to do the same.  Do not have sexual contact if you have symptoms of genital herpes. Contact a health care provider if:  Your symptoms are not improving with medicine.  Your symptoms return.  You have new symptoms.  You have a fever.  You have abdominal pain.  You have redness, swelling, or pain in your eye.  You notice new sores on other parts of your body.  You are a woman and experience bleeding between menstrual periods.  You have had herpes and you become pregnant or plan to become pregnant. Summary  Genital herpes is a common sexually transmitted infection (STI) that is caused by the herpes simplex virus (HSV) type 1 or type 2.  These viruses are most often spread through sexual contact with an infected person.  You are more likely to develop this condition if you have sex with many partners or you have unprotected sex.  Most people do not have symptoms (asymptomatic) or have mild symptoms that may be mistaken for other skin problems. Symptoms occur as outbreaks that may happen months or years apart.  There is no cure for this condition, but treatment with oral antiviral medicines can reduce symptoms, reduce the chance of spreading the virus to a partner, prevent future outbreaks, or shorten future outbreaks. This information is not intended to replace advice given to you by your health care provider. Make sure you discuss any questions you have with your health care provider. Document Released: 03/09/2000 Document Revised: 02/10/2016 Document Reviewed: 02/10/2016 Elsevier Interactive Patient Education  2017 Elsevier Inc.  

## 2016-09-12 NOTE — Progress Notes (Signed)
Patient ID: Julia Horton, female   DOB: [birth year], 56 y.o.   MRN: [id number]  55 y.o. Single African American female [phone] here with complaint of vaginal symptoms of itching, burning, and increase discharge with an odor at times. Describes discharge as clear.  Onset of symptoms 10-14 days ago but feels sore at times with wiping. Denies new personal products or vaginal dryness. New partner for 2 months. Has STD concerns. Prior history of chlamydia  01/06/16.  Urinary symptoms none . Contraception is menopausal.  LMP 08/24/2013.    Past Medical History:  Diagnosis Date  . Fibroid   . Hypertension     Past Surgical History:  Procedure Laterality Date  . BREAST SURGERY  1985   benign left breast biopsy  . CESAREAN SECTION  1989     Current Outpatient Prescriptions:  .  diclofenac (VOLTAREN) 75 MG EC tablet, Take 1 tablet (75 mg total) by mouth 2 (two) times daily., Disp: 50 tablet, Rfl: 2 .  Enalapril-Hydrochlorothiazide 5-12.5 MG tablet, Take 1 tablet by mouth 2 (two) times daily., Disp: , Rfl:  .  metroNIDAZOLE (FLAGYL) 500 MG tablet, Take 1 tablet (500 mg total) by mouth 2 (two) times daily., Disp: 14 tablet, Rfl: 0  ALLERGIES: Patient has no known allergies.  O:  Healthy female WDWN Affect: normal, orientation x 3  Physical Exam:  LMP 08/24/2013 (Approximate)  General appearance: alert, cooperative and appears stated age Abdomen:  Soft and non tender Lymph node: no enlargement or tenderness Pelvic exam: External genital: normal female with a lesion on left labia that could be HSV - culture is taken BUS: negative Vagina: thin clear discharge noted.  Affirm taken along wit GC/ CHl Cervix: normal, non tender, no CMT Uterus: normal, non tender Adnexa:normal, non tender, no masses or fullness noted   A: R/O Vaginitis  R/O HSV  R/O STD's  P: Discussed findings of vaginitis and etiology. Discussed Aveeno or baking soda sitz bath for comfort. Avoid moist clothes or pads  for extended period of time. If working out in gym clothes or swim suits for long periods of time change underwear or bottoms of swimsuit if possible. Olive Oil/Coconut Oil use for skin protection prior to activity can be used to external skin.  Rx: Valtrex 1 gm BID # 20 is started today and will follow with HSV testing  Will also follow with other STD's  Follow with Affirm  Discussed potential diagnosis and need to use protection to reduce transmission  Did not discuss suppressive therapy or intermittent therapy depending on test results.  RV prn

## 2016-09-13 LAB — HSV(HERPES SMPLX)ABS-I+II(IGG+IGM)-BLD
HSV 1 GLYCOPROTEIN G AB, IGG: 47.3 {index} — AB (ref 0.00–0.90)
HSV 2 Glycoprotein G Ab, IgG: 5.46 index — ABNORMAL HIGH (ref 0.00–0.90)
HSVI/II Comb IgM: <0.91 Ratio (ref 0.00–0.90)

## 2016-09-13 LAB — HEP, RPR, HIV PANEL
HIV Screen 4th Generation wRfx: NONREACTIVE
Hepatitis B Surface Ag: NEGATIVE
RPR Ser Ql: NONREACTIVE

## 2016-09-13 LAB — HEPATITIS C ANTIBODY

## 2016-09-14 ENCOUNTER — Encounter: Payer: Self-pay | Admitting: Obstetrics and Gynecology

## 2016-09-14 LAB — VAGINITIS/VAGINOSIS, DNA PROBE
Candida Species: NEGATIVE
GARDNERELLA VAGINALIS: POSITIVE — AB
Trichomonas vaginosis: NEGATIVE

## 2016-09-14 LAB — HERPES SIMPLEX VIRUS CULTURE

## 2016-09-14 NOTE — Progress Notes (Signed)
Encounter reviewed Julia Koslosky, MD   

## 2016-09-15 LAB — GC/CHLAMYDIA PROBE AMP
CHLAMYDIA, DNA PROBE: NEGATIVE
Neisseria gonorrhoeae by PCR: NEGATIVE

## 2016-09-17 ENCOUNTER — Telehealth: Payer: Self-pay | Admitting: *Deleted

## 2016-09-17 MED ORDER — METRONIDAZOLE 500 MG PO TABS: 500.0000 mg | ORAL_TABLET | Freq: Two times a day (BID) | ORAL | 0 refills | Status: DC

## 2016-09-17 MED ORDER — ACYCLOVIR 400 MG PO TABS: 400.0000 mg | ORAL_TABLET | Freq: Three times a day (TID) | ORAL | 0 refills | Status: AC

## 2016-09-17 NOTE — Telephone Encounter (Signed)
Spoke with patient, advised of results and recommendations as seen below per Dr. Quincy Simmonds. Rx for Flagyl po to verified pharmacy on file, ETOH precautions reviewed. Patient would like to schedule OV for further discussion, is at work and will need to return call to schedule. Patient asking if there is a more affordable alternative to valacyclovir, will cost over $100 for 10 day supply, unsure if being filled as generic or brand? Advised will review with Dr. Quincy Simmonds and return call with recommendations, patient verbalizes understanding and is agreeable.   Call to [facility], spoke with Magda Paganini, confirmed RX was being processed as valacyclovir.    Dr. Quincy Simmonds - can you recommend alternative to valacyclovir?   Cc: Kem Boroughs, NP

## 2016-09-17 NOTE — Telephone Encounter (Signed)
Spoke with patient, advised as seen below per Dr. Quincy Simmonds. Rx for Acyclovir to verified pharmacy. Patient to return call to office on 6/26 to let RN know which option she will go with after comparing cost. Patient thankful for f/u.

## 2016-09-17 NOTE — Telephone Encounter (Signed)
Patient returned call to Jill H. °

## 2016-09-17 NOTE — Telephone Encounter (Signed)
Return call to Jill H. °

## 2016-09-17 NOTE — Telephone Encounter (Signed)
Left message to call Samella Lucchetti at [phone].  

## 2016-09-17 NOTE — Telephone Encounter (Signed)
Ok to try Acyclovir 400 mg po tid x 10 days.  Dispense 30, RF none.

## 2016-09-17 NOTE — Telephone Encounter (Addendum)
Notes recorded by Nunzio Cobbs, MD on 09/15/2016 at 10:26 AM EDT This is a second results note with results of STD testing.  Please also see the other note I sent through.   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  Her GC/CT are negative.   She may want to have an appointment to discuss results.  She is a patient of mine, and I am happy to see her for a visit.     ----- Message from Nunzio Cobbs, MD sent at 09/14/2016 12:55 PM EDT ----- Please contact patient with results.  HIV, syphilis, hep B and C testing are negative.   Please inform of Affirm result showing bacterial vaginosis. She may treat with Flagyl 500 mg po bid for 7 days or Metrogel pv at hs for 5 nights.  Please send Rx to pharmacy of choice. ETOH precautions.   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  Her antibody testing indicate that these may actually be from an exposure a while ago.  I expect the Valtrex to help with her symptoms.   The HSV culture and the GC/CT are still pending.   This is my patient and I am happy to have her schedule an appointment to discuss the herpes testing.

## 2016-09-18 NOTE — Telephone Encounter (Signed)
Thank you for the update!

## 2016-09-19 NOTE — Telephone Encounter (Signed)
Left message to call Kanita Delage at [phone].  

## 2016-09-20 NOTE — Telephone Encounter (Signed)
Spoke with Magda Paganini at [facility]. Was advised acyclovir 400 mg tab picked up on 09/17/16. RN advised to cancel order for valacyclovir.    Routing to provider for final review. Patient is agreeable to disposition. Will close encounter.

## 2016-10-25 ENCOUNTER — Telehealth: Payer: Self-pay | Admitting: Obstetrics and Gynecology

## 2016-10-25 NOTE — Telephone Encounter (Signed)
Left message to call Robben Jagiello at [phone].  

## 2016-10-25 NOTE — Telephone Encounter (Signed)
Patient left voicemail that she was given a medication and it has caused her to break out in a rash

## 2016-10-31 NOTE — Telephone Encounter (Signed)
Left message to call Brittain Hosie at [phone].  

## 2016-10-31 NOTE — Telephone Encounter (Signed)
Patient returning your call.

## 2016-11-07 NOTE — Telephone Encounter (Signed)
I agree with your recommendations.  You may close the encounter.  

## 2016-11-07 NOTE — Telephone Encounter (Signed)
Spoke with patient. Patient states that she took Acyclovir 400 mg on 10/25/16 and developed a itchy, red, rash with hives under along her bra line and on her chest. Discontinued taking Acyclovir on 10/26/16. Reports she is still has red, itchy bumps around her bra line today. Has been using A&D ointment. Advised patient if reaction was related to Acyclovir it should have cleared by now based on last dose taken. Advised this may be a contact allergy or allergy to something else. Patient states she is unsure if she recently switched detergents or soaps. Denies change in eating habits or medications other than Acyclovir. Now feels it may be related to her bra. Advised to wash undergarments in hypoallergenic detergent, may use OTC hydrocortisone cream, and benadryl to see if this provides relief. Advised if symptoms persist will need to be seen for evaluation with PCP or dermatologist. Patient verbalizes understanding.  Dr.Silva, any further recommendations?

## 2016-11-07 NOTE — Telephone Encounter (Signed)
Ok to close encounter. 

## 2016-11-14 ENCOUNTER — Telehealth: Payer: Self-pay | Admitting: Obstetrics and Gynecology

## 2016-11-14 NOTE — Telephone Encounter (Signed)
I recommend an office visit with me for evaluation.  I really need to see her to help her best.

## 2016-11-14 NOTE — Telephone Encounter (Signed)
Spoke with patient. Reports rash that she reported on 11/07/16 has not resolved after changing detergents and applying A&D ointment. Patient states she did not f/u with PCP as previously recommended. Stopped acyclovir "over a week ago" and rash has not resolved. Reports small itchy, red bumps under breast and on chest. Patient adamant acyclovir that she started 09/17/16 is the cause of rash, asking "what we are going to do about it?". Advised patient would review with Dr. Quincy Simmonds and return call with recommendations, patient is agreeable.   Dr. Quincy Simmonds -please review and advise?

## 2016-11-14 NOTE — Telephone Encounter (Signed)
Spoke with patient, advised as seen below per Dr. Quincy Simmonds. Offered OV for today at 3:15 pm, patient declined. Patient states she will have to return call at later date to schedule. Patient verbalizes understanding and is agreeable.  Patient is agreeable to disposition. Will close encounter.

## 2016-11-14 NOTE — Telephone Encounter (Signed)
Patient states that the medications she was prescribed recently is giving her a rash.

## 2016-11-15 ENCOUNTER — Encounter: Payer: Self-pay | Admitting: Obstetrics and Gynecology

## 2016-11-15 NOTE — Progress Notes (Deleted)
56 y.o. [id number] Single African American female here for annual exam.    PCP:     Patient's last menstrual period was 08/24/2013 (approximate).           Sexually active: {yes no:314532}  The current method of family planning is post menopausal status.    Exercising: {yes no:314532}  {types:19826} Smoker:  {YES NO:22349}  Health Maintenance: Pap: 05-30-15 Neg:Neg HR HPV;10-01-14 Neg:Pos HR HPV History of abnormal Pap:  Yes, 10-01-14 Neg:Pos HR HPV--no colposcopy, no treatment MMG:  ***? 5-10- 10 possible mass Lt.breast/Rt.breast no masses.  Additional views and Diag.left mammogram 08-27-08 showing fibroglandular parenchymal pattern that is symmetric/neg:[facility] Colonoscopy:  ***  BMD:   n/a  Result  n/a TDaP:  *** Gardasil:   no HIV: 09-12-16 Hep C: 09-12-16 Screening Labs:  Hb today: ***, Urine today: ***   reports that she has been smoking Cigarettes.  She has never used smokeless tobacco. She reports that she drinks about 1.8 oz of alcohol per week . She reports that she does not use drugs.  Past Medical History:  Diagnosis Date  . Fibroid   . 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 Hypertension   . STD (sexually transmitted disease) 01/06/2016   Chlamydia    Past Surgical History:  Procedure Laterality Date  . BREAST SURGERY  1985   benign left breast biopsy  . CESAREAN SECTION  1989    Current Outpatient Prescriptions  Medication Sig Dispense Refill  . Enalapril-Hydrochlorothiazide 5-12.5 MG tablet Take 1 tablet by mouth 2 (two) times daily.    . metroNIDAZOLE (FLAGYL) 500 MG tablet Take 1 tablet (500 mg total) by mouth 2 (two) times daily. 14 tablet 0   No current facility-administered medications for this visit.     Family History  Problem Relation Age of Onset  . Other Father 87       dec from gunshot wound  . Hypertension Father   . Migraines Mother   . Stroke Mother        TIA  . Other Brother        MVA  . Cancer Brother    Throat Ca    ROS:  Pertinent items are noted in HPI.  Otherwise, a comprehensive ROS was negative.  Exam:   LMP 08/24/2013 (Approximate)     General appearance: alert, cooperative and appears stated age Head: Normocephalic, without obvious abnormality, atraumatic Neck: no adenopathy, supple, symmetrical, trachea midline and thyroid normal to inspection and palpation Lungs: clear to auscultation bilaterally Breasts: normal appearance, no masses or tenderness, No nipple retraction or dimpling, No nipple discharge or bleeding, No axillary or supraclavicular adenopathy Heart: regular rate and rhythm Abdomen: soft, non-tender; no masses, no organomegaly Extremities: extremities normal, atraumatic, no cyanosis or edema Skin: Skin color, texture, turgor normal. No rashes or lesions Lymph nodes: Cervical, supraclavicular, and axillary nodes normal. No abnormal inguinal nodes palpated Neurologic: Grossly normal  Pelvic: External genitalia:  no lesions              Urethra:  normal appearing urethra with no masses, tenderness or lesions              Bartholins and Skenes: normal                 Vagina: normal appearing vagina with normal color and discharge, no lesions              Cervix: no lesions  Pap taken: {yes no:314532} Bimanual Exam:  Uterus:  normal size, contour, position, consistency, mobility, non-tender              Adnexa: no mass, fullness, tenderness              Rectal exam: {yes no:314532}.  Confirms.              Anus:  normal sphincter tone, no lesions  Chaperone was present for exam.  Assessment:   Well woman visit with normal exam.   Plan: Mammogram screening discussed. Recommended self breast awareness. Pap and HR HPV as above. Guidelines for Calcium, Vitamin D, regular exercise program including cardiovascular and weight bearing exercise.   Follow up annually and prn.   Additional counseling given.  {yes [id number]. _______ minutes face to face  time of which over 50% was spent in counseling.    After visit summary provided.

## 2016-11-16 ENCOUNTER — Ambulatory Visit: Payer: BLUE CROSS/BLUE SHIELD | Admitting: Obstetrics and Gynecology

## 2016-11-28 ENCOUNTER — Telehealth: Payer: Self-pay | Admitting: *Deleted

## 2016-11-28 NOTE — Telephone Encounter (Signed)
Called and spoke with patient about another matter and I noticed this telephone note was open. I offered to reschedule the missed AEX but the patient declined stating she was at work and will call back to reschedule the missed appointment. Routing to [place] for [facility] only.

## 2016-11-28 NOTE — Telephone Encounter (Signed)
Patient in 08 recall for 10/2016. She no showed her 11-16-16 appointment. Please contact patient to schedule Thanks

## 2016-12-05 NOTE — Telephone Encounter (Signed)
Spoke with patient and tried to schedule AEX. She stated she was at lunch and would have to call back with her schedule. She promised she would call back within the next week to make appointment for AEX.

## 2016-12-17 NOTE — Telephone Encounter (Signed)
Called patient and left message to call me. She needs to schedule AEX, she is an 08 recall.

## 2016-12-25 NOTE — Telephone Encounter (Signed)
I would send letter as a friendly reminder to schedule her well woman visit with pap and let her know she is also due for a mammogram.  Her pap in 2016 showed positive high risk HPV, so she is on a yearly pap protocol at this time.   You may then take her out of recall.

## 2016-12-25 NOTE — Telephone Encounter (Signed)
Patient has been contacted several times regarding scheduling 08 AEX/PAP. Please messages below. Please advise on recall status/letter Thanks

## 2016-12-26 ENCOUNTER — Encounter: Payer: Self-pay | Admitting: *Deleted

## 2016-12-26 NOTE — Telephone Encounter (Signed)
Letter sent - removed from recall -eh 

## 2017-02-13 ENCOUNTER — Ambulatory Visit: Payer: BLUE CROSS/BLUE SHIELD | Admitting: Obstetrics and Gynecology

## 2017-03-07 ENCOUNTER — Telehealth: Payer: Self-pay | Admitting: Obstetrics and Gynecology

## 2017-03-07 NOTE — Telephone Encounter (Signed)
Patient returning your call.

## 2017-03-07 NOTE — Telephone Encounter (Signed)
Spoke with patient briefly, patient states she will have to return call.

## 2017-03-07 NOTE — Telephone Encounter (Signed)
Spoke with patient. Patient request OV on 12/19 or later for vaginal d/c. Patient states she is at work and can provide no other details. Patient aware Dr. Quincy Simmonds is on LOA is agreeable to scheduling with covering provider. OV scheduled for 12/19 at 4pm with Melvia Heaps, CNM. Patient is agreeable to date and time.   Routing to provider for final review. Patient is agreeable to disposition. Will close encounter.

## 2017-03-07 NOTE — Telephone Encounter (Signed)
Patient left voicemail at lunch.  States she is having side pain and discharge

## 2017-03-13 ENCOUNTER — Encounter: Payer: Self-pay | Admitting: Certified Nurse Midwife

## 2017-03-13 ENCOUNTER — Other Ambulatory Visit: Payer: Self-pay

## 2017-03-13 ENCOUNTER — Ambulatory Visit (INDEPENDENT_AMBULATORY_CARE_PROVIDER_SITE_OTHER): Payer: BLUE CROSS/BLUE SHIELD | Admitting: Certified Nurse Midwife

## 2017-03-13 VITALS — BP 140/84 | HR 72 | Resp 16 | Ht 61.5 in | Wt 191.0 lb

## 2017-03-13 DIAGNOSIS — N952 Postmenopausal atrophic vaginitis: Secondary | ICD-10-CM | POA: Diagnosis not present

## 2017-03-13 DIAGNOSIS — N951 Menopausal and female climacteric states: Secondary | ICD-10-CM

## 2017-03-13 DIAGNOSIS — N898 Other specified noninflammatory disorders of vagina: Secondary | ICD-10-CM | POA: Diagnosis not present

## 2017-03-13 NOTE — Addendum Note (Signed)
Addended by: Regina Eck on: 03/13/2017 04:57 PM   Modules accepted: Orders

## 2017-03-13 NOTE — Progress Notes (Signed)
56 y.o. Single African American female [phone] here with complaint of vaginal symptoms of odor only. Some urinary frequency, but has with Hypertension medication use. No pain with urination.  Onset of symptoms 2-3 weeks ago with vaginal odor.. Denies new personal products.  Some vaginal dryness. Sexually active, same partner, no change. No STD concerns.. Contraception is Menopausal.  ROS Pertinent to HPI  O:Healthy female WDWN Affect: normal, orientation x 3  Exam: Skin warm and dry Abdomen soft,non tender, no rebound, suprapubic non tender CVAT bilateral non tender  Inguinal Lymph nodes: no enlargement or tenderness Pelvic exam: External genital: normal female dryness noted, no lesions, exudate or scaling BUS: negative Bladder non tender, urethra not tender, urethral meatus no redness or tenderness Vagina: white non odorous discharge noted.  Affirm taken Cervix: normal, non tender, no CMT Uterus: normal, non tender Adnexa:normal, non tender, no masses or fullness noted      A:Normal pelvic exam No clinical signs of UTI Atrophic vaginitis R/O vaginal infection   P:Discussed findings of normal pelvic exam. Discussed no findings of UTI, declined urine. Discussed atrophic vaginitis and etiology. Discussed options for treatment external with Olive oil or coconut oil. Instructions given. Patient will try and see if this resolves the discomfort and odor. Questions addressed.  Lab; Affirm will treat if indicated   Rv prn

## 2017-03-13 NOTE — Patient Instructions (Signed)
Atrophic Vaginitis Atrophic vaginitis is when the tissues that line the vagina become dry and thin. This is caused by a drop in estrogen. Estrogen helps:  To keep the vagina moist.  To make a clear fluid that helps: ? To lubricate the vagina for sex. ? To protect the vagina from infection.  If the lining of the vagina is dry and thin, it may:  Make sex painful. It may also cause bleeding.  Cause a feeling of: ? Burning. ? Irritation. ? Itchiness.  Make an exam of your vagina painful. It may also cause bleeding.  Make you lose interest in sex.  Cause a burning feeling when you pee.  Make your vaginal fluid (discharge) brown or yellow.  For some women, there are no symptoms. This condition is most common in women who do not get their regular menstrual periods anymore (menopause). This often starts when a woman is 45-55 years old. Follow these instructions at home:  Take medicines only as told by your doctor. Do not use any herbal or alternative medicines unless your doctor says it is okay.  Use over-the-counter products for dryness only as told by your doctor. These include: ? Creams. ? Lubricants. ? Moisturizers.  Do not douche.  Do not use products that can make your vagina dry. These include: ? Scented feminine sprays. ? Scented tampons. ? Scented soaps.  If it hurts to have sex, tell your sexual partner. Contact a doctor if:  Your discharge looks different than normal.  Your vagina has an unusual smell.  You have new symptoms.  Your symptoms do not get better with treatment.  Your symptoms get worse. This information is not intended to replace advice given to you by your health care provider. Make sure you discuss any questions you have with your health care provider. Document Released: 08/29/2007 Document Revised: 08/18/2015 Document Reviewed: 03/03/2014 Elsevier Interactive Patient Education  2018 Elsevier Inc.  

## 2017-03-14 ENCOUNTER — Other Ambulatory Visit: Payer: Self-pay

## 2017-03-14 LAB — VAGINITIS/VAGINOSIS, DNA PROBE
CANDIDA SPECIES: NEGATIVE
GARDNERELLA VAGINALIS: POSITIVE — AB
Trichomonas vaginosis: NEGATIVE

## 2017-03-14 NOTE — Telephone Encounter (Signed)
lmtcb

## 2017-03-15 ENCOUNTER — Other Ambulatory Visit: Payer: Self-pay

## 2017-03-15 MED ORDER — METRONIDAZOLE 0.75 % VA GEL: VAGINAL | 0 refills | Status: DC

## 2017-03-15 NOTE — Telephone Encounter (Signed)
Patient notified of results. See lab 

## 2017-03-16 ENCOUNTER — Other Ambulatory Visit: Payer: Self-pay | Admitting: Podiatry

## 2017-03-20 ENCOUNTER — Telehealth: Payer: Self-pay | Admitting: Certified Nurse Midwife

## 2017-03-20 MED ORDER — METRONIDAZOLE 500 MG PO TABS: 500.0000 mg | ORAL_TABLET | Freq: Two times a day (BID) | ORAL | 0 refills | Status: DC

## 2017-03-20 NOTE — Telephone Encounter (Signed)
Patient called requesting a lower cost prescription, if possible, instead of Metrogel. She said she cannot afford $90.00 for this.  Pharmacy on file is correct.

## 2017-03-20 NOTE — Telephone Encounter (Signed)
Spoke with patient. Patient states that she is unable to afford Metrogel. Requesting alternative. Rx for Flagyl 500 mg po BID x 7 days #14 0RF sent to pharmacy on file. Patient is agreeable. Avoid alcohol during treatment and 24 hours after completing medication. Don't mix with alcohol if mixed can cause severe nausea, vomiting and abdominal cramping.  Routing to provider for final review. Patient agreeable to disposition. Will close encounter.

## 2017-05-08 ENCOUNTER — Ambulatory Visit: Payer: BLUE CROSS/BLUE SHIELD | Admitting: Podiatry

## 2017-05-15 ENCOUNTER — Ambulatory Visit: Payer: BLUE CROSS/BLUE SHIELD | Admitting: Podiatry

## 2017-08-01 ENCOUNTER — Telehealth: Payer: Self-pay | Admitting: Podiatry

## 2017-08-01 NOTE — Telephone Encounter (Signed)
Left message informing pt that we had not seen her in over a year and would need an appt prior to prescribing pain medication.

## 2017-08-01 NOTE — Telephone Encounter (Signed)
Patient is requesting pain medication. She stated she is still having pain. Could you please call and if she doesn't answer please a vm.

## 2017-08-12 ENCOUNTER — Telehealth: Payer: Self-pay | Admitting: Podiatry

## 2017-08-12 NOTE — Telephone Encounter (Signed)
Left message informing pt Dr. Paulla Dolly had not seen her in over a year and if she was still having a problem she needed to be seen.

## 2017-08-12 NOTE — Telephone Encounter (Signed)
Patient called and left a voicemail that she would like a refill on her medication that Dr Paulla Dolly last filled for her. If you can call her back at [phone]

## 2017-08-16 ENCOUNTER — Ambulatory Visit: Payer: BLUE CROSS/BLUE SHIELD | Admitting: Podiatry

## 2017-08-16 ENCOUNTER — Ambulatory Visit: Payer: BLUE CROSS/BLUE SHIELD

## 2017-08-16 ENCOUNTER — Encounter: Payer: Self-pay | Admitting: Podiatry

## 2017-08-16 DIAGNOSIS — M779 Enthesopathy, unspecified: Secondary | ICD-10-CM

## 2017-08-16 DIAGNOSIS — M7672 Peroneal tendinitis, left leg: Secondary | ICD-10-CM | POA: Diagnosis not present

## 2017-08-16 DIAGNOSIS — M7752 Other enthesopathy of left foot: Secondary | ICD-10-CM

## 2017-08-16 MED ORDER — TRIAMCINOLONE ACETONIDE 10 MG/ML IJ SUSP
10.0000 mg | Freq: Once | INTRAMUSCULAR | Status: AC
  Administered 2017-08-16: 10 mg

## 2017-08-19 NOTE — Progress Notes (Signed)
Subjective:   Patient ID: Julia Horton, female   DOB: 57 y.o.   MRN: [id number]   HPI Patient presents stating that she is had quite a bit of discomfort in the lateral side of the left foot and has inflammation associated with it.  Patient does not remember specific injury and also has quite a bit of swelling   ROS      Objective:  Physical Exam  Neurovascular status intact with inflammation pain at the sinus tarsi and also to the lateral ankle gutter and into the lateral perineal tendon group.  Patient also has discomfort with movement of the ankle inversion and eversion     Assessment:  Sinus tarsitis left with inflammatory changes along with tendinitis     Plan:  H&P condition reviewed and went ahead and did a sinus tarsi injection left 3 mg Kenalog 5 mg Xylocaine dispensed a fascial brace to provide for support and advised on ice therapy.  I then dispensed a surgical stocking to provide compression and we will see him back in the next several weeks  X-rays indicate that there is no indications of bone injury or advanced arthritis

## 2017-09-02 ENCOUNTER — Telehealth: Payer: Self-pay | Admitting: *Deleted

## 2017-09-02 NOTE — Telephone Encounter (Signed)
Pt states she was seen 2 weeks ago and she wanted to know why her medication had not been called to the [facility].

## 2017-09-02 NOTE — Telephone Encounter (Signed)
I reviewed LOV and Dr. Paulla Dolly had given her an injection with the steroid in it, the medication he wanted for her was in the injection, he did want her to return to the office in 2-3 weeks. Pt states she is at work and would call again with her schedule.

## 2017-09-05 ENCOUNTER — Telehealth: Payer: Self-pay | Admitting: Obstetrics and Gynecology

## 2017-09-05 NOTE — Telephone Encounter (Signed)
Patient said "I am having a little problem that I would like to have looked at". Patient is only available 09/16/17. Patient will see any provider.

## 2017-09-05 NOTE — Telephone Encounter (Signed)
Left message to call Worley Radermacher at [phone].  

## 2017-09-05 NOTE — Telephone Encounter (Signed)
Spoke with patient. Patient states she is at work, can not provide details. Requesting OV for vaginal odor and small amount of d/c. Started 1 wk ago. Denies pain or bleeding. Patient aware Melvia Heaps, CNM will be out of the office next week, agreeable to scheduling with covering provider. OV scheduled for 09/11/17 at 4pm with Dr. Quincy Simmonds. Patient request appointment reminder be mailed, address confirmed.   Appointment reminder printed and placed in mail.   Routing to provider for final review. Patient is agreeable to disposition. Will close encounter.  Cc: Melvia Heaps, CNM

## 2017-09-11 ENCOUNTER — Ambulatory Visit (INDEPENDENT_AMBULATORY_CARE_PROVIDER_SITE_OTHER): Payer: BLUE CROSS/BLUE SHIELD | Admitting: Obstetrics and Gynecology

## 2017-09-11 ENCOUNTER — Other Ambulatory Visit: Payer: Self-pay

## 2017-09-11 ENCOUNTER — Encounter: Payer: Self-pay | Admitting: Obstetrics and Gynecology

## 2017-09-11 ENCOUNTER — Other Ambulatory Visit: Payer: Self-pay | Admitting: Obstetrics and Gynecology

## 2017-09-11 VITALS — BP 128/80 | HR 76 | Resp 16 | Ht 61.5 in | Wt 193.0 lb

## 2017-09-11 DIAGNOSIS — N760 Acute vaginitis: Secondary | ICD-10-CM

## 2017-09-11 DIAGNOSIS — Z1231 Encounter for screening mammogram for malignant neoplasm of breast: Secondary | ICD-10-CM

## 2017-09-11 NOTE — Progress Notes (Signed)
GYNECOLOGY  VISIT   HPI: 57 y.o.   Single  African American  female   [phone] with Patient's last menstrual period was 08/24/2013 (approximate).   here for vaginal discharge, brownish in color. Vaginal odor per patient. No itching.   History of BV was treated with Flagyl in December 2018.   Denies vaginal bleeding.   No partner change.   Decreased libido.  Works at [employer]: Patient's last menstrual period was 08/24/2013 (approximate). Contraception:  Postmenopausal Menopausal hormone therapy:  none Last mammogram:  5-10- 10 possible mass Lt.breast/Rt.breast no masses. Additional views and Diag.left mammogram 08-27-08 showing fibroglandular parenchymal pattern that is symmetric/neg:[facility] Last pap smear:   05/30/15 Pap and HR HPV negative -- 10/01/14 - negative pap, positive HR HPV.        OB History    Gravida  3   Para  1   Term  1   Preterm  0   AB  2   Living  1     SAB  2   TAB  0   Ectopic  0   Multiple  0   Live Births  1              There are no active problems to display for this patient.   Past Medical History:  Diagnosis Date  . Fibroid   . 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 Hypertension   . STD (sexually transmitted disease) 01/06/2016   Chlamydia    Past Surgical History:  Procedure Laterality Date  . BREAST SURGERY  1985   benign left breast biopsy  . CESAREAN SECTION  1989    Current Outpatient Medications  Medication Sig Dispense Refill  . Enalapril-Hydrochlorothiazide 5-12.5 MG tablet Take 1 tablet by mouth 2 (two) times daily.     No current facility-administered medications for this visit.      ALLERGIES: Patient has no known allergies.  Family History  Problem Relation Age of Onset  . Other Father 88       dec from gunshot wound  . Hypertension Father   . Migraines Mother   . Stroke Mother        TIA  . Other Brother        MVA  . Cancer Brother         Throat Ca    Social History   Socioeconomic History  . Marital status: Single    Spouse name: Not on file  . Number of children: Not on file  . Years of education: Not on file  . Highest education level: Not on file  Occupational History  . Not on file  Social Needs  . Financial resource strain: Not on file  . Food insecurity:    Worry: Not on file    Inability: Not on file  . Transportation needs:    Medical: Not on file    Non-medical: Not on file  Tobacco Use  . Smoking status: Light Tobacco Smoker    Types: Cigarettes  . Smokeless tobacco: Never Used  . Tobacco comment: Only smokes socially when out with friends  Substance and Sexual Activity  . Alcohol use: Yes    Alcohol/week: 1.8 oz    Types: 3 Standard drinks or equivalent per week  . Drug use: No  . Sexual activity: Yes    Partners: Male    Birth control/protection: Post-menopausal  Lifestyle  . Physical activity:  Days per week: Not on file    Minutes per session: Not on file  . Stress: Not on file  Relationships  . Social connections:    Talks on phone: Not on file    Gets together: Not on file    Attends religious service: Not on file    Active member of club or organization: Not on file    Attends meetings of clubs or organizations: Not on file    Relationship status: Not on file  . Intimate partner violence:    Fear of current or ex partner: Not on file    Emotionally abused: Not on file    Physically abused: Not on file    Forced sexual activity: Not on file  Other Topics Concern  . Not on file  Social History Narrative  . Not on file    Review of Systems  Constitutional: Negative.   HENT: Negative.   Eyes: Negative.   Respiratory: Negative.   Cardiovascular: Negative.   Gastrointestinal: Negative.   Endocrine: Negative.   Genitourinary: Positive for vaginal discharge.       Vaginal odor  Musculoskeletal: Negative.   Skin: Positive for rash.  Allergic/Immunologic: Negative.    Neurological: Negative.   Hematological: Negative.   Psychiatric/Behavioral: Negative.     PHYSICAL EXAMINATION:    BP 128/80 (BP Location: Right Arm, Patient Position: Sitting, Cuff Size: Normal)   Pulse 76   Resp 16   Ht 5' 1.5" (1.562 m)   Wt 193 lb (87.5 kg)   LMP 08/24/2013 (Approximate)   BMI 35.88 kg/m     General appearance: alert, cooperative and appears stated age   Pelvic: External genitalia:  no lesions              Urethra:  normal appearing urethra with no masses, tenderness or lesions              Bartholins and Skenes: normal                 Vagina: normal appearing vagina with normal color and discharge, no lesions              Cervix: no lesions                Bimanual Exam:  Uterus:  normal size, contour, position, consistency, mobility, non-tender              Adnexa: no mass, fullness, tenderness            Chaperone was present for exam.  ASSESSMENT  Vaginitis.  Decreased libido.   PLAN  Affirm testing.  Will wait for results to initiate treatment.  I recommend updating her mammogram.  The office will facilitate in scheduling this.  After her mammogram is back and normal and she updates her annual exam, we can discuss potential treatment for decreased libido.    An After Visit Summary was printed and given to the patient.  __15____ minutes face to face time of which over 50% was spent in counseling.

## 2017-09-11 NOTE — Patient Instructions (Signed)

## 2017-09-12 LAB — VAGINITIS/VAGINOSIS, DNA PROBE
Candida Species: NEGATIVE
Gardnerella vaginalis: POSITIVE — AB
Trichomonas vaginosis: NEGATIVE

## 2017-09-13 ENCOUNTER — Other Ambulatory Visit: Payer: Self-pay | Admitting: *Deleted

## 2017-09-13 MED ORDER — METRONIDAZOLE 500 MG PO TABS: 500.0000 mg | ORAL_TABLET | Freq: Two times a day (BID) | ORAL | 0 refills | Status: DC

## 2017-10-07 ENCOUNTER — Ambulatory Visit: Payer: BLUE CROSS/BLUE SHIELD

## 2017-12-24 ENCOUNTER — Encounter: Payer: Self-pay | Admitting: Certified Nurse Midwife

## 2017-12-24 ENCOUNTER — Ambulatory Visit: Payer: BLUE CROSS/BLUE SHIELD | Admitting: Certified Nurse Midwife

## 2017-12-24 ENCOUNTER — Other Ambulatory Visit: Payer: Self-pay

## 2017-12-24 VITALS — BP 128/80 | HR 70 | Resp 16 | Wt 192.0 lb

## 2017-12-24 DIAGNOSIS — N898 Other specified noninflammatory disorders of vagina: Secondary | ICD-10-CM | POA: Diagnosis not present

## 2017-12-24 DIAGNOSIS — N951 Menopausal and female climacteric states: Secondary | ICD-10-CM | POA: Diagnosis not present

## 2017-12-24 NOTE — Progress Notes (Signed)
57 y.o. Single African American female [phone] here with complaint of vaginal odor that has increased over the last few weeks.  Describes discharge as white with odor. Patient is having vaginal dryness for sexual activity. Onset of symptoms 3 days ago. Denies new personal products or vaginal dryness. Urinary symptoms none . Contraception is menopausal. No STD concerns or testing needed. Patient dealing with right foot soreness and plans to see podiatrist soon. No other health issues today.  Review of Systems  Constitutional: Negative.   HENT: Negative.   Eyes: Negative.   Respiratory: Negative.   Cardiovascular: Negative.   Gastrointestinal: Negative.   Genitourinary:       Abnormal discharge & loss of sexual interest  Musculoskeletal: Negative.   Skin: Negative.   Neurological: Negative.   Endo/Heme/Allergies: Negative.   Psychiatric/Behavioral: Negative.     O:Healthy female WDWN Affect: normal, orientation x 3  Exam: Skin: warm and dry Abdomen: soft, non tender no masses  Inguinal Lymph nodes: no enlargement or tenderness Pelvic exam: External genital: normal female, slight atrophic appearance BUS: negative Vagina: odorous scant white discharge noted. Affirm taken Cervix: normal, non tender, no CMT Uterus: normal, non tender Adnexa:normal, non tender, no masses or fullness noted  A:Normal pelvic exam R/O vaginal infection Atrophic vaginitis with dryness   P:Discussed findings of normal pelvic exam. Discussed atrophic vaginitis and etiology. Discussed  baking soda sitz bath for comfort and odor control until lab results in. Discussed coconut oil use on regular basis for dryness which will help with sexual activity discomfort and possibly odor. Questions addressed. Instructions given for use. Lab: Affirm will treat if indicated  Rv prn

## 2017-12-24 NOTE — Patient Instructions (Signed)
Atrophic Vaginitis Atrophic vaginitis is when the tissues that line the vagina become dry and thin. This is caused by a drop in estrogen. Estrogen helps:  To keep the vagina moist.  To make a clear fluid that helps: ? To lubricate the vagina for sex. ? To protect the vagina from infection.  If the lining of the vagina is dry and thin, it may:  Make sex painful. It may also cause bleeding.  Cause a feeling of: ? Burning. ? Irritation. ? Itchiness.  Make an exam of your vagina painful. It may also cause bleeding.  Make you lose interest in sex.  Cause a burning feeling when you pee.  Make your vaginal fluid (discharge) brown or yellow.  For some women, there are no symptoms. This condition is most common in women who do not get their regular menstrual periods anymore (menopause). This often starts when a woman is 45-55 years old. Follow these instructions at home:  Take medicines only as told by your doctor. Do not use any herbal or alternative medicines unless your doctor says it is okay.  Use over-the-counter products for dryness only as told by your doctor. These include: ? Creams. ? Lubricants. ? Moisturizers.  Do not douche.  Do not use products that can make your vagina dry. These include: ? Scented feminine sprays. ? Scented tampons. ? Scented soaps.  If it hurts to have sex, tell your sexual partner. Contact a doctor if:  Your discharge looks different than normal.  Your vagina has an unusual smell.  You have new symptoms.  Your symptoms do not get better with treatment.  Your symptoms get worse. This information is not intended to replace advice given to you by your health care provider. Make sure you discuss any questions you have with your health care provider. Document Released: 08/29/2007 Document Revised: 08/18/2015 Document Reviewed: 03/03/2014 Elsevier Interactive Patient Education  2018 Elsevier Inc.  

## 2017-12-25 ENCOUNTER — Other Ambulatory Visit: Payer: Self-pay

## 2017-12-25 LAB — VAGINITIS/VAGINOSIS, DNA PROBE
Candida Species: NEGATIVE
Gardnerella vaginalis: POSITIVE — AB
Trichomonas vaginosis: NEGATIVE

## 2017-12-25 NOTE — Telephone Encounter (Signed)
Left message for call back.

## 2017-12-25 NOTE — Telephone Encounter (Signed)
Patient returning call to Joy.  

## 2017-12-26 MED ORDER — METRONIDAZOLE 500 MG PO TABS: 500.0000 mg | ORAL_TABLET | Freq: Two times a day (BID) | ORAL | 0 refills | Status: DC

## 2017-12-26 NOTE — Telephone Encounter (Signed)
Patient notified of results & rx sent to pharmacy. Alcohol precautions given.

## 2017-12-30 ENCOUNTER — Ambulatory Visit: Payer: BLUE CROSS/BLUE SHIELD | Admitting: Podiatry

## 2017-12-30 ENCOUNTER — Encounter: Payer: Self-pay | Admitting: Podiatry

## 2017-12-30 DIAGNOSIS — M722 Plantar fascial fibromatosis: Secondary | ICD-10-CM

## 2017-12-30 DIAGNOSIS — M779 Enthesopathy, unspecified: Secondary | ICD-10-CM | POA: Diagnosis not present

## 2017-12-30 MED ORDER — TRIAMCINOLONE ACETONIDE 10 MG/ML IJ SUSP
10.0000 mg | Freq: Once | INTRAMUSCULAR | Status: AC
  Administered 2017-12-30: 10 mg

## 2017-12-31 NOTE — Progress Notes (Signed)
Subjective:   Patient ID: Julia Horton, female   DOB: 57 y.o.   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  Patient states it is intensified recently and she still getting some swelling in the outside of the foot but not the same discomfort she is getting plantarly   ROS      Objective:  Physical Exam  Neurovascular status found to be intact muscle strength is adequate with range of motion within normal limits with patient will order to have exquisite discomfort plantar aspect left heel at the insertional point of the tendon into the calcaneus with fluid buildup and mild swelling in the lateral side of the foot with discomfort around the peroneal tendon group of mild discomfort in the sinus tarsi capsule     Assessment:  Acute plantar fasciitis left capsulitis of the sinus tarsi left with possibility of low-grade peroneal tendinitis left     Plan:  H&P condition reviewed.  Today to focus on the plantar fascia I injected the fascia 3 mg Kenalog 5 mg Xylocaine and gave instructions for physical therapy and discussed the lateral foot pain and do not recommend current treatment even though eventually we may need to get an MRI as possible at one point future tendon repair may be necessary

## 2018-01-01 ENCOUNTER — Telehealth: Payer: Self-pay | Admitting: Podiatry

## 2018-01-01 MED ORDER — TRAMADOL HCL 50 MG PO TABS: 50.0000 mg | ORAL_TABLET | Freq: Three times a day (TID) | ORAL | 0 refills | Status: DC | PRN

## 2018-01-01 NOTE — Telephone Encounter (Signed)
Dr. Amalia Hailey ordered Tramadol 50mg  #30 one tablet every 8 hours prn foot pain. I informed pt of the orders and told her tramadol could not be taken at work or while driving or operating machinery. Pt states understanding.

## 2018-01-01 NOTE — Telephone Encounter (Signed)
Patient said she has not received medication

## 2018-01-01 NOTE — Telephone Encounter (Signed)
Pt stated that she was suppose to receive pain medication and it was not at her pharmacy. Please call patient

## 2018-01-01 NOTE — Addendum Note (Signed)
Addended by: Harriett Sine D on: 01/01/2018 03:59 PM   Modules accepted: Orders

## 2018-01-02 NOTE — Telephone Encounter (Signed)
She could have tramadol

## 2018-01-13 ENCOUNTER — Ambulatory Visit: Payer: BLUE CROSS/BLUE SHIELD | Admitting: Podiatry

## 2018-02-16 ENCOUNTER — Emergency Department (HOSPITAL_COMMUNITY)
Admission: EM | Admit: 2018-02-16 | Discharge: 2018-02-16 | Disposition: A | Discharge status: Home / Self Care | Payer: BLUE CROSS/BLUE SHIELD | Attending: Emergency Medicine | Admitting: Emergency Medicine

## 2018-02-16 ENCOUNTER — Encounter (HOSPITAL_COMMUNITY): Payer: Self-pay | Admitting: *Deleted

## 2018-02-16 DIAGNOSIS — F17210 Nicotine dependence, cigarettes, uncomplicated: Secondary | ICD-10-CM | POA: Diagnosis not present

## 2018-02-16 DIAGNOSIS — R21 Rash and other nonspecific skin eruption: Secondary | ICD-10-CM | POA: Diagnosis present

## 2018-02-16 DIAGNOSIS — L309 Dermatitis, unspecified: Secondary | ICD-10-CM | POA: Diagnosis not present

## 2018-02-16 DIAGNOSIS — I10 Essential (primary) hypertension: Secondary | ICD-10-CM | POA: Diagnosis not present

## 2018-02-16 DIAGNOSIS — Z79899 Other long term (current) drug therapy: Secondary | ICD-10-CM | POA: Insufficient documentation

## 2018-02-16 MED ORDER — FAMOTIDINE 20 MG PO TABS
20.0000 mg | ORAL_TABLET | Freq: Once | ORAL | Status: AC
  Administered 2018-02-16: 20 mg via ORAL
  Filled 2018-02-16: qty 1

## 2018-02-16 MED ORDER — PREDNISONE 20 MG PO TABS: 60.0000 mg | ORAL_TABLET | Freq: Every day | ORAL | 0 refills | Status: AC

## 2018-02-16 MED ORDER — FAMOTIDINE 20 MG PO TABS: 20.0000 mg | ORAL_TABLET | Freq: Two times a day (BID) | ORAL | 0 refills | Status: DC

## 2018-02-16 MED ORDER — PREDNISONE 20 MG PO TABS
60.0000 mg | ORAL_TABLET | Freq: Once | ORAL | Status: AC
  Administered 2018-02-16: 60 mg via ORAL
  Filled 2018-02-16: qty 3

## 2018-02-16 NOTE — ED Provider Notes (Signed)
[facility] EMERGENCY DEPARTMENT Provider Note   CSN: [id number] Arrival date & time: 02/16/18  3267     History   Chief Complaint Chief Complaint  Patient presents with  . Rash    HPI Julia Horton is a 57 y.o. female.  Julia Horton is a 57 y.o. Female with a history of hypertension, and uterine fibroids, who presents to the emergency department for evaluation of rash which she first noticed about a week ago, she describes it as small red raised bumps around her neck over both of her arms and her back.  She reports this rash is very itchy, but not painful.  She has not noted any drainage from any of the lesions.  She denies any associated facial swelling, swelling of the lips or tongue, no shortness of breath or difficulty breathing, no nausea, vomiting or abdominal pain and no lightheadedness or syncope.  She has not had any fevers or chills.  No one else at home with similar rash.  She denies any new medications, foods, household products or jewelry.  Has been taking Benadryl with some improvement but does not like how drowsy this makes her in the rash has still not completely resolved.     Past Medical History:  Diagnosis Date  . Fibroid   . 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 Hypertension   . STD (sexually transmitted disease) 01/06/2016   Chlamydia    There are no active problems to display for this patient.   Past Surgical History:  Procedure Laterality Date  . BREAST SURGERY  1985   benign left breast biopsy  . CESAREAN SECTION  1989     OB History    Gravida  3   Para  1   Term  1   Preterm  0   AB  2   Living  1     SAB  2   TAB  0   Ectopic  0   Multiple  0   Live Births  1            Home Medications    Prior to Admission medications   Medication Sig Start Date End Date Taking? Authorizing Provider  enalapril-hydrochlorothiazide (VASERETIC) 10-25 MG tablet Take 1 tablet by mouth daily.  11/18/17   [provider]  famotidine (PEPCID) 20 MG tablet Take 1 tablet (20 mg total) by mouth 2 (two) times daily for 5 days. 02/16/18 02/21/18  Jacqlyn Larsen, PA-C  metroNIDAZOLE (FLAGYL) 500 MG tablet Take 1 tablet (500 mg total) by mouth 2 (two) times daily. 12/26/17   Regina Eck, CNM  predniSONE (DELTASONE) 20 MG tablet Take 3 tablets (60 mg total) by mouth daily for 5 days. 02/16/18 02/21/18  Jacqlyn Larsen, PA-C  traMADol (ULTRAM) 50 MG tablet Take 1 tablet (50 mg total) by mouth every 8 (eight) hours as needed. 01/01/18   Edrick Kins, DPM    Family History Family History  Problem Relation Age of Onset  . Other Father 84       dec from gunshot wound  . Hypertension Father   . Migraines Mother   . Stroke Mother        TIA  . Other Brother        MVA  . Cancer Brother        Throat Ca    Social History Social History   Tobacco Use  .  Smoking status: Light Tobacco Smoker    Types: Cigarettes  . Smokeless tobacco: Never Used  . Tobacco comment: Only smokes socially when out with friends  Substance Use Topics  . Alcohol use: Yes    Alcohol/week: 3.0 standard drinks    Types: 3 Standard drinks or equivalent per week  . Drug use: No     Allergies   Patient has no known allergies.   Review of Systems Review of Systems  Constitutional: Negative for chills and fever.  HENT: Negative for facial swelling and trouble swallowing.   Respiratory: Negative for cough, chest tightness, shortness of breath and wheezing.   Gastrointestinal: Negative for abdominal pain, nausea and vomiting.  Musculoskeletal: Negative for myalgias.  Skin: Positive for rash.  Neurological: Negative for syncope and light-headedness.     Physical Exam Updated Vital Signs BP (!) 193/98 (BP Location: Right Arm)   Pulse 80   Temp 98.3 F (36.8 C) (Oral)   Resp 16   Ht 5\' 2"  (1.575 m)   Wt 84.4 kg   LMP 08/24/2013 (Approximate)   SpO2 96%   BMI 34.02 kg/m   Physical  Exam  Constitutional: She appears well-developed and well-nourished. No distress.  HENT:  Head: Normocephalic and atraumatic.  Mouth/Throat: Oropharynx is clear and moist.  Eyes: Right eye exhibits no discharge. Left eye exhibits no discharge.  Pulmonary/Chest: Effort normal and breath sounds normal. No respiratory distress.  Musculoskeletal: She exhibits no deformity.  Neurological: She is alert. Coordination normal.  Skin: Skin is warm and dry. Rash noted. She is not diaphoretic.  Erythematous raised rash surrounding the neck, and going down the posterior aspects of both arms and somewhat scattered across the back, most consistent with dermatitis.  There are no vesicles, pustules or petechiae, no induration or fluctuance.  No lymphangitic streaking  Psychiatric: She has a normal mood and affect. Her behavior is normal.  Nursing note and vitals reviewed.    ED Treatments / Results  Labs (all labs ordered are listed, but only abnormal results are displayed) Labs Reviewed - No data to display  EKG None  Radiology No results found.  Procedures Procedures (including critical care time)  Medications Ordered in ED Medications  predniSONE (DELTASONE) tablet 60 mg (60 mg Oral Given 02/16/18 0852)  famotidine (PEPCID) tablet 20 mg (20 mg Oral Given 02/16/18 8527)     Initial Impression / Assessment and Plan / ED Course  I have reviewed the triage vital signs and the nursing notes.  Pertinent labs & imaging results that were available during my care of the patient were reviewed by me and considered in my medical decision making (see chart for details).  Rash consistent with dermatitis from unknown trigger. Patient denies any difficulty breathing or swallowing.  Pt has a patent airway without stridor and is handling secretions without difficulty; no angioedema. No blisters, no pustules, no warmth, no draining sinus tracts, no superficial abscesses, no bullous impetigo, no vesicles, no  desquamation, no target lesions with dusky purpura or a central bulla. Not tender to touch. No concern for superimposed infection. No concern for SJS, TEN, TSS, tick borne illness, syphilis or other life-threatening condition. Will discharge home with short course of steroids, pepcid and recommend Benadryl/zyrtec as needed for pruritis.  Pt's blood pressure was elevated today, pt has hx of HTN, has not taken her BP meds yet this morning, pt is not exhibiting any symptoms to suggest hypertensive urgency or emergency today, will have pt follow up with their  PCP in 1 week for blood pressure check. Discussed long term consequences of untreated hypertension with the patient.   Final Clinical Impressions(s) / ED Diagnoses   Final diagnoses:  Dermatitis    ED Discharge Orders         Ordered    famotidine (PEPCID) 20 MG tablet  2 times daily     02/16/18 0853    predniSONE (DELTASONE) 20 MG tablet  Daily     02/16/18 0853           Jacqlyn Larsen, PA-C 02/16/18 5041    Fredia Sorrow, MD 02/17/18 [phone]

## 2018-02-16 NOTE — ED Triage Notes (Signed)
Pt is here for itching rash

## 2018-02-16 NOTE — Discharge Instructions (Signed)
Your rash is likely dermatitis due to allergic reaction we frequently do not know what caused this reaction but it usually improves with steroids, Pepcid you can use Benadryl or Zyrtec as needed for itching, Zyrtec will cause less drowsiness.  You can also do cool compresses over the area.  If this is not improving after this 5-day course of medicine is very important he follow-up with your primary care doctor.  If you develop fevers, areas of drainage from the rash, nausea, vomiting or any other new or concerning symptoms return for reevaluation.  Your blood pressure was elevated today, please follow up with your primary doctor in 1 week for blood pressure recheck.

## 2018-05-05 ENCOUNTER — Telehealth: Payer: Self-pay | Admitting: Podiatry

## 2018-05-05 NOTE — Telephone Encounter (Signed)
I'm a patient there. If you could give me a call back.

## 2018-05-06 NOTE — Telephone Encounter (Signed)
Left message informing pt that if she was having a problem continuing from 12/2017, or a new problem she would need to make an appt.

## 2018-05-28 ENCOUNTER — Encounter: Payer: Self-pay | Admitting: Podiatry

## 2018-05-28 ENCOUNTER — Ambulatory Visit (INDEPENDENT_AMBULATORY_CARE_PROVIDER_SITE_OTHER): Payer: BLUE CROSS/BLUE SHIELD | Admitting: Obstetrics and Gynecology

## 2018-05-28 ENCOUNTER — Encounter: Payer: Self-pay | Admitting: Obstetrics and Gynecology

## 2018-05-28 ENCOUNTER — Ambulatory Visit (INDEPENDENT_AMBULATORY_CARE_PROVIDER_SITE_OTHER): Payer: BLUE CROSS/BLUE SHIELD

## 2018-05-28 ENCOUNTER — Other Ambulatory Visit: Payer: Self-pay | Admitting: Podiatry

## 2018-05-28 ENCOUNTER — Ambulatory Visit: Payer: BLUE CROSS/BLUE SHIELD | Admitting: Podiatry

## 2018-05-28 ENCOUNTER — Other Ambulatory Visit: Payer: Self-pay

## 2018-05-28 VITALS — BP 156/88 | HR 66 | Ht 61.5 in | Wt 192.0 lb

## 2018-05-28 DIAGNOSIS — N760 Acute vaginitis: Secondary | ICD-10-CM | POA: Diagnosis not present

## 2018-05-28 DIAGNOSIS — M25572 Pain in left ankle and joints of left foot: Secondary | ICD-10-CM

## 2018-05-28 DIAGNOSIS — M7672 Peroneal tendinitis, left leg: Secondary | ICD-10-CM | POA: Diagnosis not present

## 2018-05-28 DIAGNOSIS — M722 Plantar fascial fibromatosis: Secondary | ICD-10-CM

## 2018-05-28 MED ORDER — TRIAMCINOLONE ACETONIDE 10 MG/ML IJ SUSP
10.0000 mg | Freq: Once | INTRAMUSCULAR | Status: AC
  Administered 2018-05-28: 10 mg

## 2018-05-28 NOTE — Progress Notes (Signed)
GYNECOLOGY  VISIT   HPI: 58 y.o.   Single  African American  female   [id number] with Patient's last menstrual period was 08/24/2013 (approximate).   here for vaginal discharge with slight odor.   No itching or burning.   Multiple episodes of BV.   Started using a bubble bath again.   No change in partner.   GYNECOLOGIC HISTORY: Patient's last menstrual period was 08/24/2013 (approximate). Contraception: Postmenopausal Menopausal hormone therapy:  none Last mammogram:  08-02-08 possible mass Lt.breast/Rt.breast no masses. Additional views and Diag.left mammogram 08-27-08 showing fibroglandular parenchymal pattern that is symmetric/neg:[facility] Last pap smear:   05/30/15 Pap and HR HPV negative -- 10/01/14 - negative pap, positive HR HPV.        OB History    Gravida  3   Para  1   Term  1   Preterm  0   AB  2   Living  1     SAB  2   TAB  0   Ectopic  0   Multiple  0   Live Births  1              There are no active problems to display for this patient.   Past Medical History:  Diagnosis Date  . Fibroid   . 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 Hypertension   . STD (sexually transmitted disease) 01/06/2016   Chlamydia    Past Surgical History:  Procedure Laterality Date  . BREAST SURGERY  1985   benign left breast biopsy  . CESAREAN SECTION  1989    Current Outpatient Medications  Medication Sig Dispense Refill  . enalapril-hydrochlorothiazide (VASERETIC) 10-25 MG tablet Take 1 tablet by mouth daily.  3   No current facility-administered medications for this visit.      ALLERGIES: Patient has no known allergies.  Family History  Problem Relation Age of Onset  . Other Father 53       dec from gunshot wound  . Hypertension Father   . Migraines Mother   . Stroke Mother        TIA  . Other Brother        MVA  . Cancer Brother        Throat Ca    Social History   Socioeconomic History  . Marital status: Single     Spouse name: Not on file  . Number of children: Not on file  . Years of education: Not on file  . Highest education level: Not on file  Occupational History  . Not on file  Social Needs  . Financial resource strain: Not on file  . Food insecurity:    Worry: Not on file    Inability: Not on file  . Transportation needs:    Medical: Not on file    Non-medical: Not on file  Tobacco Use  . Smoking status: Light Tobacco Smoker    Types: Cigarettes  . Smokeless tobacco: Never Used  . Tobacco comment: Only smokes socially when out with friends  Substance and Sexual Activity  . Alcohol use: Yes    Alcohol/week: 3.0 standard drinks    Types: 3 Standard drinks or equivalent per week  . Drug use: No  . Sexual activity: Yes    Partners: Male    Birth control/protection: Post-menopausal  Lifestyle  . Physical activity:    Days per week: Not on file    Minutes  per session: Not on file  . Stress: Not on file  Relationships  . Social connections:    Talks on phone: Not on file    Gets together: Not on file    Attends religious service: Not on file    Active member of club or organization: Not on file    Attends meetings of clubs or organizations: Not on file    Relationship status: Not on file  . Intimate partner violence:    Fear of current or ex partner: Not on file    Emotionally abused: Not on file    Physically abused: Not on file    Forced sexual activity: Not on file  Other Topics Concern  . Not on file  Social History Narrative  . Not on file    Review of Systems  All other systems reviewed and are negative.   PHYSICAL EXAMINATION:    BP (!) 156/88 (BP Location: Right Arm, Patient Position: Sitting, Cuff Size: Large) Comment: forgot to take b/p medicine today  Pulse 66   Ht 5' 1.5" (1.562 m)   Wt 192 lb (87.1 kg)   LMP 08/24/2013 (Approximate)   BMI 35.69 kg/m     General appearance: alert, cooperative and appears stated age  Pelvic: External genitalia:   no lesions              Urethra:  normal appearing urethra with no masses, tenderness or lesions              Bartholins and Skenes: normal                 Vagina: normal appearing vagina with normal color and discharge, no lesions              Cervix: no lesions                Bimanual Exam:  Uterus:  normal size, contour, position, consistency, mobility, non-tender              Adnexa: no mass, fullness, tenderness            Chaperone was present for exam.  ASSESSMENT  Vaginitis.  Hx recurrent BV.   PLAN  Affirm.  Prefers Flagyl for treatment.  She declines a suppression regimen. I recommend she schedules a mammogram to get up to date with her breast cancer screening.  She will also schedule an annal exam for this month.    An After Visit Summary was printed and given to the patient.  ___15___ minutes face to face time of which over 50% was spent in counseling.

## 2018-05-28 NOTE — Patient Instructions (Signed)
Vaginitis  Vaginitis is a condition in which the vaginal tissue swells and becomes red (inflamed). This condition is most often caused by a change in the normal balance of bacteria and yeast that live in the vagina. This change causes an overgrowth of certain bacteria or yeast, which causes the inflammation. There are different types of vaginitis, but the most common types are:   Bacterial vaginosis.   Yeast infection (candidiasis).   Trichomoniasis vaginitis. This is a sexually transmitted disease (STD).   Viral vaginitis.   Atrophic vaginitis.   Allergic vaginitis.  What are the causes?  The cause of this condition depends on the type of vaginitis. It can be caused by:   Bacteria (bacterial vaginosis).   Yeast, which is a fungus (yeast infection).   A parasite (trichomoniasis vaginitis).   A virus (viral vaginitis).   Low hormone levels (atrophic vaginitis). Low hormone levels can occur during pregnancy, breastfeeding, or after menopause.   Irritants, such as bubble baths, scented tampons, and feminine sprays (allergic vaginitis).  Other factors can change the normal balance of the yeast and bacteria that live in the vagina. These include:   Antibiotic medicines.   Poor hygiene.   Diaphragms, vaginal sponges, spermicides, birth control pills, and intrauterine devices (IUD).   Sex.   Infection.   Uncontrolled diabetes.   A weakened defense (immune) system.  What increases the risk?  This condition is more likely to develop in women who:   Smoke.   Use vaginal douches, scented tampons, or scented sanitary pads.   Wear tight-fitting pants.   Wear thong underwear.   Use oral birth control pills or an IUD.   Have sex without a condom.   Have multiple sex partners.   Have an STD.   Frequently use the spermicide nonoxynol-9.   Eat lots of foods high in sugar.   Have uncontrolled diabetes.   Have low estrogen levels.   Have a weakened immune system from an immune disorder or medical  treatment.   Are pregnant or breastfeeding.  What are the signs or symptoms?  Symptoms vary depending on the cause of the vaginitis. Common symptoms include:   Abnormal vaginal discharge.  ? The discharge is white, gray, or yellow with bacterial vaginosis.  ? The discharge is thick, white, and cheesy with a yeast infection.  ? The discharge is frothy and yellow or greenish with trichomoniasis.   A bad vaginal smell. The smell is fishy with bacterial vaginosis.   Vaginal itching, pain, or swelling.   Sex that is painful.   Pain or burning when urinating.  Sometimes there are no symptoms.  How is this diagnosed?  This condition is diagnosed based on your symptoms and medical history. A physical exam, including a pelvic exam, will also be done. You may also have other tests, including:   Tests to determine the pH level (acidity or alkalinity) of your vagina.   A whiff test, to assess the odor that results when a sample of your vaginal discharge is mixed with a potassium hydroxide solution.   Tests of vaginal fluid. A sample will be examined under a microscope.  How is this treated?  Treatment varies depending on the type of vaginitis you have. Your treatment may include:   Antibiotic creams or pills to treat bacterial vaginosis and trichomoniasis.   Antifungal medicines, such as vaginal creams or suppositories, to treat a yeast infection.   Medicine to ease discomfort if you have viral vaginitis. Your sexual partner   should also be treated.   Estrogen delivered in a cream, pill, suppository, or vaginal ring to treat atrophic vaginitis. If vaginal dryness occurs, lubricants and moisturizing creams may help. You may need to avoid scented soaps, sprays, or douches.   Stopping use of a product that is causing allergic vaginitis. Then using a vaginal cream to treat the symptoms.  Follow these instructions at home:  Lifestyle   Keep your genital area clean and dry. Avoid soap, and only rinse the area with  water.   Do not douche or use tampons until your health care provider says it is okay to do so. Use sanitary pads, if needed.   Do not have sex until your health care provider approves. When you can return to sex, practice safe sex and use condoms.   Wipe from front to back. This avoids the spread of bacteria from the rectum to the vagina.  General instructions   Take over-the-counter and prescription medicines only as told by your health care provider.   If you were prescribed an antibiotic medicine, take or use it as told by your health care provider. Do not stop taking or using the antibiotic even if you start to feel better.   Keep all follow-up visits as told by your health care provider. This is important.  How is this prevented?   Use mild, non-scented products. Do not use things that can irritate the vagina, such as fabric softeners. Avoid the following products if they are scented:  ? Feminine sprays.  ? Detergents.  ? Tampons.  ? Feminine hygiene products.  ? Soaps or bubble baths.   Let air reach your genital area.  ? Wear cotton underwear to reduce moisture buildup.  ? Avoid wearing underwear while you sleep.  ? Avoid wearing tight pants and underwear or nylons without a cotton panel.  ? Avoid wearing thong underwear.   Take off any wet clothing, such as bathing suits, as soon as possible.   Practice safe sex and use condoms.  Contact a health care provider if:   You have abdominal pain.   You have a fever.   You have symptoms that last for more than 2-3 days.  Get help right away if:   You have a fever and your symptoms suddenly get worse.  Summary   Vaginitis is a condition in which the vaginal tissue becomes inflamed.This condition is most often caused by a change in the normal balance of bacteria and yeast that live in the vagina.   Treatment varies depending on the type of vaginitis you have.   Do not douche, use tampons , or have sex until your health care provider approves. When  you can return to sex, practice safe sex and use condoms.  This information is not intended to replace advice given to you by your health care provider. Make sure you discuss any questions you have with your health care provider.  Document Released: 01/07/2007 Document Revised: 04/17/2016 Document Reviewed: 04/17/2016  Elsevier Interactive Patient Education  2019 [employer]

## 2018-05-28 NOTE — Progress Notes (Signed)
Subjective:   Patient ID: Julia Horton, female   DOB: 58 y.o.   MRN: [id number]   HPI Patient states she is having pain in the outside of her left foot that occurs at different times and becomes inflamed and at times it does not hurt at all and she states that she needs some time at work where she does not have to stand   ROS      Objective:  Physical Exam  Neurovascular status intact with inflammation of the peroneal tendon left with no apparent rupture with plantar fasciitis that occasionally irritated     Assessment:  Peroneal tendinitis left with mild plantar fasciitis     Plan:  I am getting give her an accommodation at work when she overdoes it and I did a careful sheath injection peroneal tendon left 3 mg Kenalog 5 g Xylocaine and discussed if it were to get worse at one point future we may need to do MRI but at this point on trying to keep her out of pain  X-ray dated today does not indicate arthritis of the subtalar or midtarsal joint

## 2018-05-29 ENCOUNTER — Telehealth: Payer: Self-pay | Admitting: *Deleted

## 2018-05-29 LAB — VAGINITIS/VAGINOSIS, DNA PROBE
Candida Species: NEGATIVE
Gardnerella vaginalis: POSITIVE — AB
Trichomonas vaginosis: NEGATIVE

## 2018-05-29 NOTE — Telephone Encounter (Signed)
-----   Message from Nunzio Cobbs, MD sent at 05/29/2018  2:15 PM EST ----- Please inform of Affirm result showing bacterial vaginosis. She may treat with Flagyl 500 mg po bid for 7 days or Metrogel pv at hs for 5 nights.  Please send Rx to pharmacy of choice. ETOH precautions.   I did offer for her to return for retesting after treatment in order to start suppression, and she declined this option.

## 2018-05-29 NOTE — Telephone Encounter (Signed)
Notes recorded by Burnice Logan, RN on 05/29/2018 at 2:38 PM EST Left message to call Sharee Pimple, RN at [place].

## 2018-06-02 MED ORDER — METRONIDAZOLE 500 MG PO TABS: 500.0000 mg | ORAL_TABLET | Freq: Two times a day (BID) | ORAL | 0 refills | Status: DC

## 2018-06-02 NOTE — Telephone Encounter (Signed)
Patient returned call. She is given results from Dr. Quincy Simmonds.  She states she will call if she would like to plan treatment for suppression.  Instructions regarding use of flagyl given and avoidance of all ETOH containing products.

## 2018-07-18 ENCOUNTER — Ambulatory Visit: Payer: BLUE CROSS/BLUE SHIELD | Admitting: Obstetrics and Gynecology

## 2018-08-14 ENCOUNTER — Telehealth: Payer: Self-pay | Admitting: Obstetrics and Gynecology

## 2018-08-14 NOTE — Telephone Encounter (Signed)
Spoke with patient. Patient has hx of recurrent BV, reports vaginal odor and brown vaginal discharge. Patient states suppression therapy discussed at last OV. Denies pelvic pain, vaginal bleeding, fever/chills. Advised patient OV needed for further evaluation, OV declined for today. OV scheduled for 5/27 at 4pm with Dr. Quincy Simmonds.   Routing to provider for final review. Patient is agreeable to disposition. Will close encounter.

## 2018-08-14 NOTE — Telephone Encounter (Signed)
Patient says she think she may have another bacterial infection.

## 2018-08-20 ENCOUNTER — Ambulatory Visit: Payer: BLUE CROSS/BLUE SHIELD | Admitting: Obstetrics and Gynecology

## 2018-08-25 ENCOUNTER — Ambulatory Visit: Payer: BLUE CROSS/BLUE SHIELD | Admitting: Obstetrics and Gynecology

## 2018-08-25 ENCOUNTER — Other Ambulatory Visit: Payer: Self-pay

## 2018-08-25 ENCOUNTER — Encounter: Payer: Self-pay | Admitting: Obstetrics and Gynecology

## 2018-08-25 VITALS — BP 142/96 | HR 80 | Temp 97.4°F | Resp 14 | Ht 61.5 in | Wt 196.0 lb

## 2018-08-25 DIAGNOSIS — N760 Acute vaginitis: Secondary | ICD-10-CM

## 2018-08-25 NOTE — Progress Notes (Signed)
GYNECOLOGY  VISIT   HPI: 58 y.o.   Single  African American  female   [phone] with Patient's last menstrual period was 08/24/2013 (approximate).   here for possible BV. Per patient has a small odor with a small amount of discharge.  Hx recurrent BV.  Last infection was 05/28/18.  Treated with Flagyl.  She has previously declines a suppression regimen.  She now would like this.   Started bubble baths again.   Having sexual activity and odor follows this.   Works for [employer] HISTORY: Patient's last menstrual period was 08/24/2013 (approximate). Contraception:  Postmenopausal Menopausal hormone therapy:  none Last mammogram:  08-02-08 possible mass Lt.breast/Rt.breast no masses. Additional views and Diag.left mammogram 08-27-08 showing fibroglandular parenchymal pattern that is symmetric/neg:[facility] Last pap smear:   05/30/15 Pap and HR HPV negative --10/01/14 - negative pap, positive HR HPV        OB History    Gravida  3   Para  1   Term  1   Preterm  0   AB  2   Living  1     SAB  2   TAB  0   Ectopic  0   Multiple  0   Live Births  1              There are no active problems to display for this patient.   Past Medical History:  Diagnosis Date  . Fibroid   . 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 Hypertension   . STD (sexually transmitted disease) 01/06/2016   Chlamydia    Past Surgical History:  Procedure Laterality Date  . BREAST SURGERY  1985   benign left breast biopsy  . CESAREAN SECTION  1989    Current Outpatient Medications  Medication Sig Dispense Refill  . enalapril-hydrochlorothiazide (VASERETIC) 10-25 MG tablet Take 1 tablet by mouth daily.  3   No current facility-administered medications for this visit.      ALLERGIES: Patient has no known allergies.  Family History  Problem Relation Age of Onset  . Other Father 88       dec from gunshot wound  . Hypertension Father   .  Migraines Mother   . Stroke Mother        TIA  . Other Brother        MVA  . Cancer Brother        Throat Ca    Social History   Socioeconomic History  . Marital status: Single    Spouse name: Not on file  . Number of children: Not on file  . Years of education: Not on file  . Highest education level: Not on file  Occupational History  . Not on file  Social Needs  . Financial resource strain: Not on file  . Food insecurity:    Worry: Not on file    Inability: Not on file  . Transportation needs:    Medical: Not on file    Non-medical: Not on file  Tobacco Use  . Smoking status: Light Tobacco Smoker    Types: Cigarettes  . Smokeless tobacco: Never Used  . Tobacco comment: Only smokes socially when out with friends  Substance and Sexual Activity  . Alcohol use: Yes    Alcohol/week: 3.0 standard drinks    Types: 3 Standard drinks or equivalent per week  . Drug use: No  .  Sexual activity: Yes    Partners: Male    Birth control/protection: Post-menopausal  Lifestyle  . Physical activity:    Days per week: Not on file    Minutes per session: Not on file  . Stress: Not on file  Relationships  . Social connections:    Talks on phone: Not on file    Gets together: Not on file    Attends religious service: Not on file    Active member of club or organization: Not on file    Attends meetings of clubs or organizations: Not on file    Relationship status: Not on file  . Intimate partner violence:    Fear of current or ex partner: Not on file    Emotionally abused: Not on file    Physically abused: Not on file    Forced sexual activity: Not on file  Other Topics Concern  . Not on file  Social History Narrative  . Not on file    Review of Systems  Constitutional: Negative.   HENT: Negative.   Eyes: Negative.   Respiratory: Negative.   Cardiovascular: Negative.   Gastrointestinal: Negative.   Endocrine: Negative.   Genitourinary: Positive for vaginal discharge.        Vaginal odor  Musculoskeletal: Negative.   Skin: Negative.   Allergic/Immunologic: Negative.   Neurological: Negative.   Hematological: Negative.   Psychiatric/Behavioral: Negative.     PHYSICAL EXAMINATION:    BP (!) 142/96 (BP Location: Right Arm, Patient Position: Sitting, Cuff Size: Normal)   Pulse 80   Temp (!) 97.4 F (36.3 C) (Temporal)   Resp 14   Ht 5' 1.5" (1.562 m)   Wt 196 lb (88.9 kg)   LMP 08/24/2013 (Approximate)   BMI 36.43 kg/m     General appearance: alert, cooperative and appears stated age  Pelvic: External genitalia:  no lesions              Urethra:  normal appearing urethra with no masses, tenderness or lesions              Bartholins and Skenes: normal                 Vagina: normal appearing vagina with normal color and discharge, no lesions              Cervix: no lesions                Bimanual Exam:  Uterus:  normal size, contour, position, consistency, mobility, non-tender              Adnexa: no mass, fullness, tenderness   Chaperone was present for exam.  ASSESSMENT  Hx recurrent BV.   PLAN  Affirm.  We discussed recurrent BV and option for tx with Flagyl 500 mg po bid x 7 - 10 days followed by Metrogel pv at hs twice weekly for 4 - 6 months.  Patient would like a suppression regimen now.  We discussed new data about probiotic use for recurrent BV that is not yet commercially available.    An After Visit Summary was printed and given to the patient.  _15_____ minutes face to face time of which over 50% was spent in counseling.

## 2018-08-26 LAB — VAGINITIS/VAGINOSIS, DNA PROBE
Candida Species: NEGATIVE
Gardnerella vaginalis: NEGATIVE
Trichomonas vaginosis: NEGATIVE

## 2018-08-28 ENCOUNTER — Telehealth: Payer: Self-pay

## 2018-08-28 MED ORDER — METRONIDAZOLE 0.75 % VA GEL: VAGINAL | 5 refills | Status: DC

## 2018-08-28 NOTE — Telephone Encounter (Signed)
-----   Message from Nunzio Cobbs, MD sent at 08/27/2018 11:18 AM EDT ----- Please report results of vaginitis testing which is negative for infection.  The patient will now start suppression treatment for BV.  She needs a prescription for Metrogel pv at hs twice a week for 6 months.  Please sent to pharmacy of choice.

## 2018-08-28 NOTE — Telephone Encounter (Signed)
Reviewed results of neg.BV with patient. Advised of need for suppression treatment with Metrogel as per Dr.Silva's note. Instructions given and patient voices understanding. Rx sent to pharmacy on file.

## 2018-09-25 ENCOUNTER — Ambulatory Visit: Payer: BC Managed Care – PPO | Admitting: Obstetrics and Gynecology

## 2018-11-03 ENCOUNTER — Ambulatory Visit (INDEPENDENT_AMBULATORY_CARE_PROVIDER_SITE_OTHER): Payer: BC Managed Care – PPO | Admitting: Podiatry

## 2018-11-03 ENCOUNTER — Encounter: Payer: Self-pay | Admitting: Podiatry

## 2018-11-03 ENCOUNTER — Other Ambulatory Visit: Payer: Self-pay

## 2018-11-03 VITALS — Temp 98.4°F

## 2018-11-03 DIAGNOSIS — M7672 Peroneal tendinitis, left leg: Secondary | ICD-10-CM

## 2018-11-04 NOTE — Progress Notes (Signed)
Subjective:   Patient ID: Julia Horton, female   DOB: 58 y.o.   MRN: [id number]   HPI Patient states on the outside of the left ankle it is gotten sore again and I had good relief for around 5 months   ROS      Objective:  Physical Exam  Neurovascular status intact with patient's peroneal group left found to be very inflamed painful with 5 months of relief with injection     Assessment:  Possibility for peroneal tendinitis versus possibility for some form of tendon pathology or tear left     Plan:  Reviewed this with her and the fact we may need to get MRI but at this time we will get a continue conservative care and I did do sterile prep and I injected that she the left peroneal tendon 3 mg Dexasone Kenalog 5 mg Xylocaine and advised on ice therapy anti-inflammatories reappoint to recheck signed visit

## 2019-06-09 ENCOUNTER — Telehealth: Payer: Self-pay | Admitting: Certified Nurse Midwife

## 2019-06-09 NOTE — Telephone Encounter (Signed)
Patient requested an appointment for discharge. Appointment scheduled per patient request 06/17/19@4pm  with Melvia Heaps. Patient states she has to have 4 days notice in order to get off work for an appointment.

## 2019-06-15 ENCOUNTER — Ambulatory Visit: Payer: BC Managed Care – PPO | Admitting: Certified Nurse Midwife

## 2019-06-16 NOTE — Progress Notes (Deleted)
59 y.o. Single {Race/ethnicity:[id number]} female [id number] here with complaint of vaginal symptoms of itching, burning, and increase discharge. Describes discharge as ***. Onset of symptoms *** days ago. Denies new personal products or vaginal dryness. *** STD concerns. Urinary symptoms *** . Contraception is  ROS  O:Healthy female WDWN Affect: normal, orientation x 3  Exam: Abdomen: Lymph node: no enlargement or tenderness Pelvic exam: External genital: normal female BUS: negative Vagina: *** discharge noted. Ph:   ,Wet prep taken, Affirm taken Cervix: normal, non tender, no CMT Uterus: normal, non tender Adnexa:normal, non tender, no masses or fullness noted   Wet Prep results:   A:Normal pelvic exam   P:Discussed findings of *** and etiology. Discussed Aveeno or baking soda sitz bath for comfort. Avoid moist clothes or pads for extended period of time. If working out in gym clothes or swim suits for long periods of time change underwear or bottoms of swimsuit if possible. Coconut Oil use for skin protection prior to activity can be used to external skin for protection or dryness. Rx: ***  Rv prn

## 2019-06-17 ENCOUNTER — Other Ambulatory Visit: Payer: Self-pay | Admitting: Obstetrics and Gynecology

## 2019-06-17 ENCOUNTER — Ambulatory Visit: Payer: BC Managed Care – PPO | Admitting: Certified Nurse Midwife

## 2019-06-17 ENCOUNTER — Encounter: Payer: Self-pay | Admitting: Obstetrics and Gynecology

## 2019-06-17 ENCOUNTER — Other Ambulatory Visit (HOSPITAL_COMMUNITY)
Admission: RE | Admit: 2019-06-17 | Discharge: 2019-06-17 | Disposition: A | Discharge status: Home / Self Care | Payer: BC Managed Care – PPO | Source: Ambulatory Visit | Attending: Obstetrics and Gynecology | Admitting: Obstetrics and Gynecology

## 2019-06-17 ENCOUNTER — Ambulatory Visit (INDEPENDENT_AMBULATORY_CARE_PROVIDER_SITE_OTHER): Payer: BC Managed Care – PPO | Admitting: Obstetrics and Gynecology

## 2019-06-17 ENCOUNTER — Other Ambulatory Visit: Payer: Self-pay

## 2019-06-17 ENCOUNTER — Encounter: Payer: Self-pay | Admitting: Certified Nurse Midwife

## 2019-06-17 VITALS — BP 142/90 | HR 70 | Temp 96.8°F | Resp 16 | Ht 61.0 in | Wt 192.0 lb

## 2019-06-17 DIAGNOSIS — N898 Other specified noninflammatory disorders of vagina: Secondary | ICD-10-CM

## 2019-06-17 DIAGNOSIS — Z01419 Encounter for gynecological examination (general) (routine) without abnormal findings: Secondary | ICD-10-CM

## 2019-06-17 DIAGNOSIS — Z1211 Encounter for screening for malignant neoplasm of colon: Secondary | ICD-10-CM

## 2019-06-17 DIAGNOSIS — Z1151 Encounter for screening for human papillomavirus (HPV): Secondary | ICD-10-CM | POA: Insufficient documentation

## 2019-06-17 DIAGNOSIS — Z1231 Encounter for screening mammogram for malignant neoplasm of breast: Secondary | ICD-10-CM

## 2019-06-17 DIAGNOSIS — B9689 Other specified bacterial agents as the cause of diseases classified elsewhere: Secondary | ICD-10-CM | POA: Insufficient documentation

## 2019-06-17 DIAGNOSIS — Z113 Encounter for screening for infections with a predominantly sexual mode of transmission: Secondary | ICD-10-CM

## 2019-06-17 DIAGNOSIS — N760 Acute vaginitis: Secondary | ICD-10-CM | POA: Diagnosis not present

## 2019-06-17 NOTE — Progress Notes (Signed)
59 y.o. [id number] Single African American female here for annual exam.    Patient complaining of vaginal discharge. She has some odor.  No itching.   No new partner.   She agrees STD testing and vaginitis testing.    She has been doing bubble baths and drinking Red Bulls.   She did a suppression regimen with Metrogel for recurrent BV.   No vaginal bleeding or spotting.   PCP: [facility].   Patient's last menstrual period was 08/24/2013 (approximate).           Sexually active: Yes.    The current method of family planning is post menopausal status.    Exercising: Yes.    walks her dog Smoker:  Smokes socially with friends  Health Maintenance: Pap: 05-30-15 Neg:Neg HR HPV, 10-01-14 Neg:Pos HR HPV--Pt.never had colpo History of abnormal Pap: yes, 10-01-14 Neg:Pos HR HPV--never had colpo MMG: 08-02-08 Poss Lt.Br.mass, Rt.Br.Neg. Lt.Br.Diag./Neg/BiRads1--knows she needs to schedule Colonoscopy: NEVER BMD:   n/a  Result  n/a TDaP: within last 10 yrs with PCP Gardasil:   no HIV: 09-12-16 NR Hep C: 09-12-16 Neg Screening Labs:  PCP.    reports that she has been smoking cigarettes. She has never used smokeless tobacco. She reports current alcohol use of about 3.0 standard drinks of alcohol per week. She reports that she does not use drugs.  Past Medical History:  Diagnosis Date  . Fibroid   . 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 Hypertension   . STD (sexually transmitted disease) 01/06/2016   Chlamydia    Past Surgical History:  Procedure Laterality Date  . BREAST SURGERY  1985   benign left breast biopsy  . CESAREAN SECTION  1989    Current Outpatient Medications  Medication Sig Dispense Refill  . enalapril-hydrochlorothiazide (VASERETIC) 10-25 MG tablet Take 1 tablet by mouth daily.  3   No current facility-administered medications for this visit.    Family History  Problem Relation Age of Onset  . Other Father 64       dec  from gunshot wound  . Hypertension Father   . Migraines Mother   . Stroke Mother        TIA  . Other Brother        MVA  . Cancer Brother        Throat Ca    Review of Systems  Constitutional:       Hot flashes, vaginal discharge  All other systems reviewed and are negative.   Exam:   BP (!) 142/90 (Cuff Size: Large)   Pulse 70   Temp (!) 96.8 F (36 C) (Temporal)   Resp 16   Ht 5\' 1"  (1.549 m)   Wt 192 lb (87.1 kg)   LMP 08/24/2013 (Approximate)   BMI 36.28 kg/m     General appearance: alert, cooperative and appears stated age Head: normocephalic, without obvious abnormality, atraumatic Neck: no adenopathy, supple, symmetrical, trachea midline and thyroid normal to inspection and palpation Lungs: clear to auscultation bilaterally Breasts: normal appearance, no masses or tenderness, No nipple retraction or dimpling, No nipple discharge or bleeding, No axillary adenopathy Heart: regular rate and rhythm Abdomen: soft, non-tender; no masses, no organomegaly Extremities: extremities normal, atraumatic, no cyanosis or edema Skin: skin color, texture, turgor normal. No rashes or lesions Lymph nodes: cervical, supraclavicular, and axillary nodes normal. Neurologic: grossly normal  Pelvic: External genitalia:  no lesions  No abnormal inguinal nodes palpated.              Urethra:  normal appearing urethra with no masses, tenderness or lesions              Bartholins and Skenes: normal                 Vagina: normal appearing vagina with normal color and discharge, no lesions              Cervix: no lesions              Pap taken: Yes.   Bimanual Exam:  Uterus:  normal size, contour, position, consistency, mobility, non-tender              Adnexa: no mass, fullness, tenderness              Rectal exam: Yes.  .  Confirms.              Anus:  normal sphincter tone, no lesions  Chaperone was present for exam.  Assessment:   Well woman visit with normal  exam. HTN.  HSV 1 and 2.  Hx recurrent vaginitis.  Overdue for mammogram.  Tobacco use.  Social.   Plan: Mammogram screening discussed.  We will schedule her mammogram.  Self breast awareness reviewed. Pap and HR HPV as above. Guidelines for Calcium, Vitamin D, regular exercise program including cardiovascular and weight bearing exercise. IFOB.  She declines colonoscopy. Vaginitis and cervicitis testing.  Declines serum STD screening.  Follow up annually and prn.   After visit summary provided.

## 2019-06-17 NOTE — Progress Notes (Signed)
Patient scheduled for screening MMG 07-17-19 9:00am at [facility]. Appt. Slip given to patient.

## 2019-06-17 NOTE — Patient Instructions (Signed)

## 2019-06-18 LAB — CYTOLOGY - PAP
Adequacy: ABSENT
Comment: NEGATIVE
Diagnosis: NEGATIVE
High risk HPV: NEGATIVE

## 2019-06-18 LAB — CERVICOVAGINAL ANCILLARY ONLY
Bacterial Vaginitis (gardnerella): POSITIVE — AB
Candida Glabrata: NEGATIVE
Candida Vaginitis: NEGATIVE
Chlamydia: NEGATIVE
Comment: NEGATIVE
Comment: NEGATIVE
Comment: NEGATIVE
Comment: NEGATIVE
Comment: NEGATIVE
Comment: NORMAL
Neisseria Gonorrhea: NEGATIVE
Trichomonas: NEGATIVE

## 2019-06-19 ENCOUNTER — Other Ambulatory Visit: Payer: Self-pay

## 2019-06-19 DIAGNOSIS — N760 Acute vaginitis: Secondary | ICD-10-CM

## 2019-06-19 MED ORDER — NONFORMULARY OR COMPOUNDED ITEM: 0 refills | Status: DC

## 2019-06-19 MED ORDER — METRONIDAZOLE 500 MG PO TABS: 500.0000 mg | ORAL_TABLET | Freq: Two times a day (BID) | ORAL | 0 refills | Status: DC

## 2019-07-17 ENCOUNTER — Ambulatory Visit
Admission: RE | Admit: 2019-07-17 | Discharge: 2019-07-17 | Disposition: A | Discharge status: Home / Self Care | Payer: BC Managed Care – PPO | Source: Ambulatory Visit | Attending: Obstetrics and Gynecology | Admitting: Obstetrics and Gynecology

## 2019-07-17 ENCOUNTER — Other Ambulatory Visit: Payer: Self-pay

## 2019-07-17 DIAGNOSIS — Z1231 Encounter for screening mammogram for malignant neoplasm of breast: Secondary | ICD-10-CM

## 2020-01-17 ENCOUNTER — Other Ambulatory Visit: Payer: Self-pay | Admitting: Obstetrics and Gynecology

## 2020-01-17 DIAGNOSIS — N760 Acute vaginitis: Secondary | ICD-10-CM

## 2020-02-09 NOTE — Progress Notes (Signed)
GYNECOLOGY  VISIT  CC:   Vaginal discharge and odor  HPI: 59 y.o. [id number] Single Black or African American female here for vaginal discharge with odor. Denies pain, itching or irritation.  Asking questions about decreased libido.    GYNECOLOGIC HISTORY: Patient's last menstrual period was 08/24/2013 (approximate). Contraception: postmenopausal Menopausal hormone therapy: none  Patient Active Problem List   Diagnosis Date Noted  . Recurrent vaginitis 08/25/2018    Past Medical History:  Diagnosis Date  . Fibroid   . 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 Hypertension   . STD (sexually transmitted disease) 01/06/2016   Chlamydia    Past Surgical History:  Procedure Laterality Date  . BREAST LUMPECTOMY Left   . BREAST SURGERY  1985   benign left breast biopsy  . CESAREAN SECTION  1989    MEDS:   Current Outpatient Medications on File Prior to Visit  Medication Sig Dispense Refill  . amLODipine (NORVASC) 5 MG tablet Take 5 mg by mouth daily.     No current facility-administered medications on file prior to visit.    ALLERGIES: Patient has no known allergies.  Family History  Problem Relation Age of Onset  . Other Father 70       dec from gunshot wound  . Hypertension Father   . Migraines Mother   . Stroke Mother        TIA  . Other Brother        MVA  . Cancer Brother        Throat Ca    SH:  In a 4 year relationship, does not have concern of infidelity  Review of Systems  Constitutional: Negative.   HENT: Negative.   Eyes: Negative.   Respiratory: Negative.   Cardiovascular: Negative.   Gastrointestinal: Negative.   Endocrine: Negative.   Genitourinary:       Vaginal discharge & odor  Musculoskeletal: Negative.   Skin: Negative.   Allergic/Immunologic: Negative.   Neurological: Negative.   Hematological: Negative.   Psychiatric/Behavioral: Negative.     PHYSICAL EXAMINATION:    BP 120/78   Pulse 68   Resp 16   Wt 191 lb  (86.6 kg)   LMP 08/24/2013 (Approximate)   BMI 36.09 kg/m     General appearance: alert, cooperative and appears stated age   Pelvic: External genitalia:  no lesions              Urethra:  normal appearing urethra with no masses, tenderness or lesions              Bartholins and Skenes: normal                 Vagina: redness, smooth tissue, scant discharge              Cervix: no cervical motion tenderness and no lesions              Bimanual Exam:  Uterus:  normal size, contour, position, consistency, mobility, non-tender              Adnexa: no mass, fullness, tenderness              Wet prep: neg clue, neg trich, few wbc KOH: neg yeast PH: 4.5  Chaperone, Joy, CMA, was present for exam.  Assessment: Atrophic vaginitis  Plan: Estrace vaginal cream F/u for regularly scheduled annual exam and re-evaluation.  PT to return if symptoms do not improve

## 2020-02-12 ENCOUNTER — Ambulatory Visit (INDEPENDENT_AMBULATORY_CARE_PROVIDER_SITE_OTHER): Payer: BC Managed Care – PPO | Admitting: Nurse Practitioner

## 2020-02-12 ENCOUNTER — Other Ambulatory Visit: Payer: Self-pay

## 2020-02-12 ENCOUNTER — Encounter: Payer: Self-pay | Admitting: Nurse Practitioner

## 2020-02-12 VITALS — BP 120/78 | HR 68 | Resp 16 | Wt 191.0 lb

## 2020-02-12 DIAGNOSIS — N952 Postmenopausal atrophic vaginitis: Secondary | ICD-10-CM

## 2020-02-12 MED ORDER — ESTRADIOL 0.1 MG/GM VA CREA: 0.5000 | TOPICAL_CREAM | VAGINAL | 1 refills | Status: DC

## 2020-02-12 NOTE — Patient Instructions (Signed)
Atrophic Vaginitis Atrophic vaginitis is a condition in which the tissues that line the vagina become dry and thin. This condition occurs in women who have stopped having their period. It is caused by a drop in a female hormone (estrogen). This hormone helps:  To keep the vagina moist.  To make a clear fluid. This clear fluid helps: ? To make the vagina ready for sex. ? To protect the vagina from infection. If the lining of the vagina is dry and thin, it may cause irritation, burning, or itchiness. It may also:  Make sex painful.  Make an exam of your vagina painful.  Cause bleeding.  Make you lose interest in sex.  Cause a burning feeling when you pee (urinate).  Cause a brown or yellow fluid to come from your vagina. Some women do not have symptoms. Follow these instructions at home: Medicines  Take over-the-counter and prescription medicines only as told by your doctor.  Do not use herbs or other medicines unless your doctor says it is okay.  Use medicines for for dryness. These include: ? Oils to make the vagina soft. ? Creams. ? Moisturizers. General instructions  Do not douche.  Do not use products that can make your vagina dry. These include: ? Scented sprays. ? Scented tampons. ? Scented soaps.  Sex can help increase blood flow and soften the tissue in the vagina. If it hurts to have sex: ? Tell your partner. ? Use products to make sex more comfortable. Use these only as told by your doctor. Contact a doctor if you:  Have discharge from the vagina that is different than usual.  Have a bad smell coming from your vagina.  Have new symptoms.  Do not get better.  Get worse. Summary  Atrophic vaginitis is a condition in which the lining of the vagina becomes dry and thin.  This condition affects women who have stopped having their periods.  Treatment may include using products that help make the vagina soft.  Call a doctor if do not get better with  treatment. This information is not intended to replace advice given to you by your health care provider. Make sure you discuss any questions you have with your health care provider. Document Revised: 03/25/2017 Document Reviewed: 03/25/2017 Elsevier Patient Education  2020 Elsevier Inc.  

## 2020-02-22 ENCOUNTER — Telehealth: Payer: Self-pay

## 2020-02-22 DIAGNOSIS — N952 Postmenopausal atrophic vaginitis: Secondary | ICD-10-CM

## 2020-02-22 NOTE — Telephone Encounter (Signed)
Patient want alterative med for estrace vaginal cream. Due to price.

## 2020-02-23 MED ORDER — ESTRADIOL 0.1 MG/GM VA CREA: 0.5000 | TOPICAL_CREAM | VAGINAL | 1 refills | Status: DC

## 2020-02-23 NOTE — Telephone Encounter (Signed)
Patient is returning call.  °

## 2020-02-23 NOTE — Telephone Encounter (Signed)
Spoke with pt. Pt states cost prohibitive with insurance for Estrace vag cream. Pt advised to either use Good Rx coupon or have alt med. Pt agreeable to use Good Rx coupon. Will print out coupon and pt to pick up at front office on 02/24/20. Pt agreeable and verbalized understanding. Pt thankful for help.  New Rx sent to [facility] to use Good Rx coupon.  Routing to [place], NP for review Encounter closed

## 2020-02-23 NOTE — Telephone Encounter (Signed)
Left message for pt to return call to triage RN. 

## 2020-03-26 DIAGNOSIS — U071 COVID-19: Secondary | ICD-10-CM

## 2020-03-26 HISTORY — DX: COVID-19: U07.1

## 2021-02-06 ENCOUNTER — Encounter: Payer: Self-pay | Admitting: Podiatry

## 2021-02-06 ENCOUNTER — Ambulatory Visit (INDEPENDENT_AMBULATORY_CARE_PROVIDER_SITE_OTHER): Payer: BC Managed Care – PPO

## 2021-02-06 ENCOUNTER — Ambulatory Visit (INDEPENDENT_AMBULATORY_CARE_PROVIDER_SITE_OTHER): Payer: BC Managed Care – PPO | Admitting: Podiatry

## 2021-02-06 ENCOUNTER — Other Ambulatory Visit: Payer: Self-pay

## 2021-02-06 DIAGNOSIS — M79672 Pain in left foot: Secondary | ICD-10-CM

## 2021-02-06 DIAGNOSIS — M7752 Other enthesopathy of left foot: Secondary | ICD-10-CM | POA: Diagnosis not present

## 2021-02-06 MED ORDER — TRIAMCINOLONE ACETONIDE 10 MG/ML IJ SUSP
10.0000 mg | Freq: Once | INTRAMUSCULAR | Status: AC
  Administered 2021-02-06: 10 mg

## 2021-02-08 NOTE — Progress Notes (Signed)
Subjective:   Patient ID: Julia Horton, female   DOB: 60 y.o.   MRN: [id number]   HPI Patient presents stating her left ankle has started to hurt.  She has not been in here in over 2 years and states that it has just recently become painful again   ROS      Objective:  Physical Exam  Scaler status intact with patient's left ankle sinus tarsi quite inflamed with fluid buildup within the sinus tarsi itself     Assessment:  Acute sinus tarsitis with left involvement with moderate flatfoot deformity     Plan:  H&P reviewed condition and at this point she is been very successful with injection so I did sterile prep injected the sinus tarsi left 3 mg Kenalog 5 mg Xylocaine and advised on reduced activity and reappoint.  X-rays indicate there is no signs of fracture there is no signs of aggressive arthritis with this condition

## 2021-02-27 ENCOUNTER — Ambulatory Visit: Payer: BC Managed Care – PPO | Admitting: Podiatry

## 2021-05-15 NOTE — Progress Notes (Deleted)
GYNECOLOGY  VISIT   HPI: 61 y.o.   Single  African American  female   [phone] with Patient's last menstrual period was 08/24/2013 (approximate).   here for     GYNECOLOGIC HISTORY: Patient's last menstrual period was 08/24/2013 (approximate). 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     enalapril-hydrochlorothiazide (VASERETIC) 10-25 MG tablet Take 1 tablet by mouth daily.     estradiol (ESTRACE VAGINAL) 0.1 MG/GM vaginal cream Place 0.5 Applicatorfuls vaginally 3 (three) times a week. 42.5 g 1   No current facility-administered medications for this visit.     ALLERGIES: Patient has no known allergies.  Family History  Problem Relation Age of Onset   Other Father 67       dec from gunshot wound   Hypertension Father    Migraines Mother    Stroke Mother        TIA   Other Brother        MVA   Cancer Brother        Throat Ca    Social History   Socioeconomic History   Marital status: Single    Spouse name: Not on file   Number of children: Not on file   Years of education: Not on file    Highest education level: Not on file  Occupational History   Not on file  Tobacco Use   Smoking status: Light Smoker    Types: Cigarettes   Smokeless tobacco: Never   Tobacco comments:    Only smokes socially when out with friends  Vaping Use   Vaping Use: Never used  Substance and Sexual Activity   Alcohol use: Yes    Alcohol/week: 3.0 standard drinks    Types: 3 Glasses of wine per week   Drug use: No   Sexual activity: Yes    Partners: Male    Birth control/protection: Post-menopausal  Other Topics Concern   Not on file  Social History Narrative   Not on file   Social Determinants of Health   Financial Resource Strain: Not on file  Food Insecurity: Not on file  Transportation Needs: Not on file  Physical Activity: Not on file  Stress: Not on file  Social Connections:  Not on file  Intimate Partner Violence: Not on file    Review of Systems  PHYSICAL EXAMINATION:    LMP 08/24/2013 (Approximate)     General appearance: alert, cooperative and appears stated age Head: Normocephalic, without obvious abnormality, atraumatic Neck: no adenopathy, supple, symmetrical, trachea midline and thyroid normal to inspection and palpation Lungs: clear to auscultation bilaterally Breasts: normal appearance, no masses or tenderness, No nipple retraction or dimpling, No nipple discharge or bleeding, No axillary or supraclavicular adenopathy Heart: regular rate and rhythm Abdomen: soft, non-tender, no masses,  no organomegaly Extremities: extremities normal, atraumatic, no cyanosis or edema Skin: Skin color, texture, turgor normal. No rashes or lesions Lymph nodes: Cervical, supraclavicular, and axillary nodes normal. No abnormal inguinal nodes palpated Neurologic: Grossly normal  Pelvic: External genitalia:  no lesions              Urethra:  normal appearing urethra with no masses, tenderness or lesions              Bartholins and Skenes: normal                 Vagina: normal  appearing vagina with normal color and discharge, no lesions              Cervix: no lesions                Bimanual Exam:  Uterus:  normal size, contour, position, consistency, mobility, non-tender              Adnexa: no mass, fullness, tenderness              Rectal exam: {yes no:[id number]}.  Confirms.              Anus:  normal sphincter tone, no lesions  Chaperone was present for exam:  ***  ASSESSMENT     PLAN     An After Visit Summary was printed and given to the patient.  ______ minutes face to face time of which over 50% was spent in counseling.

## 2021-05-16 ENCOUNTER — Encounter: Payer: Self-pay | Admitting: Anesthesiology

## 2021-05-16 ENCOUNTER — Ambulatory Visit: Payer: BC Managed Care – PPO | Admitting: Obstetrics and Gynecology

## 2021-05-16 ENCOUNTER — Encounter: Payer: Self-pay | Admitting: Obstetrics and Gynecology

## 2021-05-16 ENCOUNTER — Other Ambulatory Visit: Payer: Self-pay

## 2021-05-16 VITALS — BP 130/82 | HR 76 | Ht 61.5 in | Wt 191.0 lb

## 2021-05-16 DIAGNOSIS — Z01419 Encounter for gynecological examination (general) (routine) without abnormal findings: Secondary | ICD-10-CM

## 2021-05-16 DIAGNOSIS — Z1211 Encounter for screening for malignant neoplasm of colon: Secondary | ICD-10-CM

## 2021-05-16 DIAGNOSIS — N898 Other specified noninflammatory disorders of vagina: Secondary | ICD-10-CM | POA: Diagnosis not present

## 2021-05-16 LAB — WET PREP FOR TRICH, YEAST, CLUE

## 2021-05-16 NOTE — Progress Notes (Signed)
61 y.o. [id number] Single African American female here for annual exam.    Patient complaining of vaginal odor and a little discharge.  Doing bubble baths.  Has not done any over the counter treatments.   Declines STD testing.  Not active right now.  No partner change.   Has done suppression regiment with Metrogel in the past.   No HSV outbreaks.  Grandchildren are doing well.   Working at [employer].  Worried about her job stability.  Going cruising in October.   PCP:  [facility]  Patient's last menstrual period was 08/24/2013 (approximate).      Sexually active: No.  The current method of family planning is post menopausal status.    Exercising: Yes.     Walks dog regularly Smoker:  Smokes socially with friends  Health Maintenance: Pap:   06-17-19 Neg:Neg HR HPV, 05-30-15 Neg:Neg HR HPV, 10-01-14 Neg:Pos HR HPV History of abnormal Pap:  yes, 10-01-14 Neg:Pos HR HPV--never had colpo MMG:  4-23-21Neg/Birads1--pt.knows she needs to schedule Colonoscopy:  NEVER BMD:   n/a  Result  n/a TDaP:  PCP Gardasil:   no HIV:09-12-16 NR Hep C:09-12-16 Neg Screening Labs:  PCP Flu vaccine:  declined. Covid vaccine:  booster x 1.    reports that she has been smoking cigarettes. She has never used smokeless tobacco. She reports current alcohol use of about 3.0 standard drinks per week. She reports that she does not use drugs.  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     No current facility-administered medications for this  visit.    Family History  Problem Relation Age of Onset   Other Father 42       dec from gunshot wound   Hypertension Father    Migraines Mother    Stroke Mother        TIA   Other Brother        MVA   Cancer Brother        Throat Ca    Review of Systems  Genitourinary:  Positive for vaginal discharge (vaginal odor).  All other systems reviewed and are negative.  Exam:   BP 130/82    Pulse 76    Ht 5' 1.5" (1.562 m)    Wt 191 lb (86.6 kg)    LMP 08/24/2013 (Approximate)    SpO2 97%    BMI 35.50 kg/m     General appearance: alert, cooperative and appears stated age Head: normocephalic, without obvious abnormality, atraumatic Neck: no adenopathy, supple, symmetrical, trachea midline and thyroid normal to inspection and palpation Lungs: clear to auscultation bilaterally Breasts: normal appearance, no masses or tenderness, No nipple retraction or dimpling, No nipple discharge or bleeding, No axillary adenopathy Heart: regular rate and rhythm Abdomen: soft, non-tender; no masses, no organomegaly Extremities: extremities normal, atraumatic, no cyanosis or edema Skin: skin color, texture, turgor normal. No rashes or lesions Lymph nodes: cervical, supraclavicular, and axillary nodes normal. Neurologic: grossly  normal  Pelvic: External genitalia:  no lesions              No abnormal inguinal nodes palpated.              Urethra:  normal appearing urethra with no masses, tenderness or lesions              Bartholins and Skenes: normal                 Vagina: normal appearing vagina with normal color and discharge, no lesions              Cervix: no lesions              Pap taken: no Bimanual Exam:  Uterus:  normal size, contour, position, consistency, mobility, non-tender              Adnexa: no mass, fullness, tenderness              Rectal exam: yes.  Confirms.              Anus:  normal sphincter tone, no lesions  Chaperone was present for exam:  Lovena Le, CMA  Assessment:    Well woman visit with gynecologic exam. HTN.  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  No known outbreaks. Vaginal odor.  Overdue for mammogram.  Tobacco use.  Social.  Colon cancer screening.   Plan: Mammogram screening discussed.  Information given for her to schedule.  Self breast awareness reviewed. Pap and HR HPV 2024.  Guidelines for Calcium, Vitamin D, regular exercise program including cardiovascular and weight bearing exercise. Wet prep today:  negative for clue cells, yeast, and trichomonas.  No Rx needed. Cologuard ordered.  Brochure on Cologuard to patient also. Follow up annually and prn.   After visit summary provided.

## 2021-05-16 NOTE — Patient Instructions (Signed)

## 2021-08-16 ENCOUNTER — Telehealth: Payer: Self-pay | Admitting: Obstetrics and Gynecology

## 2021-08-16 NOTE — Telephone Encounter (Signed)
Please contact patient regarding her Cologuard testing.   I received notification in Epic that no results are available.  I assume the specimen has not been sent in yet.  Please have her check the expiration on the kit.   If the Cologuard is not expired and if it is not received by the lab in 2 weeks, I will cancel the test.

## 2021-08-17 NOTE — Telephone Encounter (Signed)
Left message for patient to call.

## 2021-08-22 NOTE — Telephone Encounter (Signed)
Patient informed. 

## 2021-10-23 ENCOUNTER — Encounter: Payer: Self-pay | Admitting: Podiatry

## 2021-10-23 ENCOUNTER — Ambulatory Visit (INDEPENDENT_AMBULATORY_CARE_PROVIDER_SITE_OTHER): Payer: BC Managed Care – PPO | Admitting: Podiatry

## 2021-10-23 DIAGNOSIS — M7752 Other enthesopathy of left foot: Secondary | ICD-10-CM | POA: Diagnosis not present

## 2021-10-23 MED ORDER — TRIAMCINOLONE ACETONIDE 10 MG/ML IJ SUSP
10.0000 mg | Freq: Once | INTRAMUSCULAR | Status: AC
  Administered 2021-10-23: 10 mg

## 2021-10-23 NOTE — Progress Notes (Signed)
Subjective:   Patient ID: Julia Horton, female   DOB: 61 y.o.   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

## 2022-02-07 ENCOUNTER — Encounter: Payer: Self-pay | Admitting: Obstetrics and Gynecology

## 2022-02-07 ENCOUNTER — Ambulatory Visit: Payer: BC Managed Care – PPO | Admitting: Obstetrics and Gynecology

## 2022-02-07 VITALS — BP 134/80 | Ht 61.0 in | Wt 184.0 lb

## 2022-02-07 DIAGNOSIS — N898 Other specified noninflammatory disorders of vagina: Secondary | ICD-10-CM | POA: Diagnosis not present

## 2022-02-07 DIAGNOSIS — R350 Frequency of micturition: Secondary | ICD-10-CM | POA: Diagnosis not present

## 2022-02-07 DIAGNOSIS — N760 Acute vaginitis: Secondary | ICD-10-CM

## 2022-02-07 LAB — WET PREP FOR TRICH, YEAST, CLUE

## 2022-02-07 MED ORDER — METRONIDAZOLE 500 MG PO TABS: 500.0000 mg | ORAL_TABLET | Freq: Two times a day (BID) | ORAL | 0 refills | Status: DC

## 2022-02-07 NOTE — Progress Notes (Signed)
GYNECOLOGY  VISIT   HPI: 61 y.o.   Single  African American  female   [phone] with Patient's last menstrual period was 08/24/2013 (approximate).   here for   vaginal odor. Pt is worried about PH balance. Notices odor when she voids.   No vaginal itching.  No discharge.   No pain or urgency with voiding.  Voiding frequency, much more than usual.   She does drink beer.  Takes a diuretic.   She has done suppression for BV with Metrogel in the past.   She has not self treated with any medication.   Taking bubble baths.  She thinks this may be her trigger for the BV.   Not sexually active for months, in March.  No partner change.   Some vaginal dryness.   No recent abx.   GYNECOLOGIC HISTORY: Patient's last menstrual period was 08/24/2013 (approximate). Contraception:  post menopausal  Menopausal hormone therapy:  n/a Last mammogram:  07/17/19 BI-RADS CATEGORY 1 NEG.  Recommended.  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     amLODipine (NORVASC) 5 MG tablet Take 5 mg by mouth daily. (Patient not taking: Reported on 10/23/2021)     No current facility-administered  medications for this visit.     ALLERGIES: Patient has no known allergies.  Family History  Problem Relation Age of Onset   Other Father 63       dec from gunshot wound   Hypertension Father    Migraines Mother    Stroke Mother        TIA   Other Brother        MVA   Cancer Brother        Throat Ca    Social History   Socioeconomic History   Marital status: Single    Spouse name: Not on file   Number of children: Not on file   Years of education: Not on file   Highest education level: Not on file  Occupational History   Not on file  Tobacco Use   Smoking status: Light Smoker    Types: Cigarettes   Smokeless tobacco: Never   Tobacco comments:    Only smokes socially when out with friends  Vaping Use   Vaping Use: Never used  Substance and Sexual Activity  Alcohol use: Yes    Alcohol/week: 3.0 standard drinks of alcohol    Types: 3 Glasses of wine per week   Drug use: No   Sexual activity: Yes    Partners: Male    Birth control/protection: Post-menopausal  Other Topics Concern   Not on file  Social History Narrative   Not on file   Social Determinants of Health   Financial Resource Strain: Not on file  Food Insecurity: Not on file  Transportation Needs: Not on file  Physical Activity: Not on file  Stress: Not on file  Social Connections: Not on file  Intimate Partner Violence: Not on file    Review of Systems  Genitourinary:        Vaginal odor  All other systems reviewed and are negative.   PHYSICAL EXAMINATION:    BP 134/80 (BP Location: Left Arm, Patient Position: Sitting, Cuff Size: Normal)   Ht '5\' 1"'$  (1.549 m)   Wt 184 lb (83.5 kg)   LMP 08/24/2013 (Approximate)   BMI 34.77 kg/m     General appearance: alert, cooperative and appears stated age   Pelvic: External genitalia:  no lesions              Urethra:  normal appearing urethra with no masses, tenderness or lesions              Bartholins and Skenes: normal                  Vagina: normal appearing vagina with normal color and small amount of white discharge, no lesions              Cervix: no lesions                Bimanual Exam:  Uterus:  normal size, contour, position, consistency, mobility, non-tender              Adnexa: no mass, fullness, tenderness            Chaperone was present for exam:  Kimalexis  ASSESSMENT  Vaginal odor.  Urinary frequency.   Vaginal dryness.   PLAN  Wet prep:  clue cells present, negative yeast, negative trichomonas. Flagyl 500 mg po bid x 7 days.  Urinalysis:  sg 1.025, ph 5.5, 0 - 5 WBC, NS RBC, 6 - 10 epis, many bacteria, moderate mucus and clue cells present. UC pending.  No abx for UTI at this time.  We discussed bladder irritants:  caffeine, carbonated beverages, and ETOH. We reviewed menopausal vaginal atrophy and treatment with water based lubricants, cooking oils, and vaginal estrogens.  She will update her mammogram and then may consider vaginal estrogen.  FU for annual exam in February.    An After Visit Summary was printed and given to the patient.  30 min  total time was spent for this patient encounter, including preparation, face-to-face counseling with the patient, coordination of care, and documentation of the encounter.

## 2022-02-07 NOTE — Patient Instructions (Signed)
Atrophic Vaginitis  Atrophic vaginitis is a condition in which the tissues that line the vagina become dry and thin. This condition is most common in women who have stopped having regular menstrual periods (are in menopause). This usually starts when a woman is 45 to 59 years old. That is the time when a woman's estrogen levels begin to decrease. Estrogen is a female hormone. It helps to keep the tissues of the vagina moist. It stimulates the vagina to produce a clear fluid that lubricates the vagina for sex. This fluid also protects the vagina from infection. Lack of estrogen can cause the lining of the vagina to get thinner and dryer. The vagina may also shrink in size. It may become less elastic. Atrophic vaginitis tends to get worse over time as a woman's estrogen level drops. What are the causes? This condition is caused by the normal drop in estrogen that happens around the time of menopause. What increases the risk? Certain conditions or situations may lower a woman's estrogen level, leading to a higher risk for atrophic vaginitis. You are more likely to develop this condition if: You are taking medicines that block estrogen. You have had your ovaries removed. You are being treated for cancer with radiation or medicines (chemotherapy). You have given birth or are breastfeeding. You are older than age 50. You smoke. What are the signs or symptoms? Symptoms of this condition include: Pain, soreness, a feeling of pressure, or bleeding during sex (dyspareunia). Vaginal burning, irritation, or itching. Pain or bleeding when a speculum is used in a vaginal exam. Having burning pain while urinating. Vaginal discharge. In some cases, there are no symptoms. How is this diagnosed? This condition is diagnosed based on your medical history and a physical exam. This will include a pelvic exam that checks the vaginal tissues. Though rare, you may also have other tests, including: A urine test. A  test that checks the acid balance in your vagina (acid balance test). How is this treated? Treatment for this condition depends on how severe your symptoms are. Treatment may include: Using an over-the-counter vaginal lubricant before sex. Using a long-acting vaginal moisturizer. Using low-dose estrogen for moderate to severe symptoms that do not respond to other treatments. Options include creams, tablets, and inserts (vaginal rings). Before you use a vaginal estrogen, tell your health care provider if you have a history of: Breast cancer. Endometrial cancer. Blood clots. If you are not sexually active and your symptoms are very mild, you may not need treatment. Follow these instructions at home: Medicines Take over-the-counter and prescription medicines only as told by your health care provider. Do not use herbal or alternative medicines unless your health care provider says that you can. Use over-the-counter creams, lubricants, or moisturizers for dryness only as told by your health care provider. General instructions If your atrophic vaginitis is caused by menopause, discuss all of your menopause symptoms and treatment options with your health care provider. Do not douche. Do not use products that can make your vagina dry. These include: Scented feminine sprays. Scented tampons. Scented soaps. Vaginal sex can help to improve blood flow and elasticity of vaginal tissue. If you choose to have sex and it hurts, try using a water-soluble lubricant or moisturizer right before having sex. Contact a health care provider if: Your discharge looks different than normal. Your vagina has an unusual smell. You have new symptoms. Your symptoms do not improve with treatment. Your symptoms get worse. Summary Atrophic vaginitis is a condition   in which the tissues that line the vagina become dry and thin. It is most common in women who have stopped having regular menstrual periods (are in  menopause). Treatment options include using vaginal lubricants and low-dose vaginal estrogen. Contact a health care provider if your vagina has an unusual smell, or if your symptoms get worse or do not improve after treatment. This information is not intended to replace advice given to you by your health care provider. Make sure you discuss any questions you have with your health care provider. Document Revised: 09/10/2019 Document Reviewed: 09/10/2019 Elsevier Patient Education  2023 Elsevier Inc.  

## 2022-02-09 LAB — URINALYSIS, COMPLETE W/RFL CULTURE
Bilirubin Urine: NEGATIVE
Casts: NONE SEEN /LPF
Crystals: NONE SEEN " /HPF"
Glucose, UA: NEGATIVE
Hgb urine dipstick: NEGATIVE
Hyaline Cast: NONE SEEN /LPF
Leukocyte Esterase: NEGATIVE
Nitrites, Initial: NEGATIVE
Protein, ur: NEGATIVE
RBC / HPF: NONE SEEN /HPF (ref 0–2)
Specific Gravity, Urine: 1.025 (ref 1.001–1.035)
Yeast: NONE SEEN /HPF
pH: 5.5 (ref 5.0–8.0)

## 2022-02-09 LAB — CULTURE INDICATED

## 2022-02-09 LAB — URINE CULTURE
MICRO NUMBER:: 14192877
Result:: NO GROWTH
SPECIMEN QUALITY:: ADEQUATE

## 2022-05-09 NOTE — Progress Notes (Deleted)
62 y.o. [id number] Single African American female here for annual exam.    PCP:     Patient's last menstrual period was 08/24/2013 (approximate).           Sexually active: {yes no:314532}  The current method of family planning is post menopausal status.    Exercising: {yes no:314532}  {types:19826} Smoker:  smokes socially w/ friends  Health Maintenance: Pap:  06/17/19 neg: HR HPV neg, 05-30-15 Neg:Neg HR HPV, 10-01-14 Neg:Pos HR HPV  History of abnormal Pap:  yes, 10-01-14 Neg:Pos HR HPV--never had colpo  MMG:  07/17/19 Breast Density Category B, BI-RADS CATEGORY 1 Neg Colonoscopy:  n/a BMD:   n/a  Result  n/a TDaP:  PCP Gardasil:   no HIV: 09/12/16 NR Hep C: 09/12/16 Neg Screening Labs:  Hb today: ***, Urine today: ***   reports that she has been smoking cigarettes. She has never used smokeless tobacco. She reports current alcohol use of about 3.0 standard drinks of alcohol per week. She reports that she does not use drugs.  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 (Patient not taking: Reported on 10/23/2021)     enalapril-hydrochlorothiazide (VASERETIC) 10-25 MG tablet Take 1 tablet by mouth daily.     metroNIDAZOLE (FLAGYL) 500 MG tablet Take 1 tablet (500 mg total) by mouth 2 (two) times daily. 14 tablet 0   No current facility-administered medications for this visit.    Family History  Problem Relation Age of Onset   Other Father 72       dec from gunshot wound   Hypertension Father    Migraines Mother    Stroke Mother         TIA   Other Brother        MVA   Cancer Brother        Throat Ca    Review of Systems  Exam:   LMP 08/24/2013 (Approximate)     General appearance: alert, cooperative and appears stated age Head: normocephalic, without obvious abnormality, atraumatic Neck: no adenopathy, supple, symmetrical, trachea midline and thyroid normal to inspection and palpation Lungs: clear to auscultation bilaterally Breasts: normal appearance, no masses or tenderness, No nipple retraction or dimpling, No nipple discharge or bleeding, No axillary adenopathy Heart: regular rate and rhythm Abdomen: soft, non-tender; no masses, no organomegaly Extremities: extremities normal, atraumatic, no cyanosis or edema Skin: skin color, texture, turgor normal. No rashes or lesions Lymph nodes: cervical, supraclavicular, and axillary nodes normal. Neurologic: grossly normal  Pelvic: External genitalia:  no lesions              No abnormal inguinal nodes palpated.              Urethra:  normal appearing urethra with no masses, tenderness or lesions              Bartholins and Skenes: normal  Vagina: normal appearing vagina with normal color and discharge, no lesions              Cervix: no lesions              Pap taken: {yes no:314532} Bimanual Exam:  Uterus:  normal size, contour, position, consistency, mobility, non-tender              Adnexa: no mass, fullness, tenderness              Rectal exam: {yes no:314532}.  Confirms.              Anus:  normal sphincter tone, no lesions  Chaperone was present for exam:  ***  Assessment:   Well woman visit with gynecologic exam.   Plan: Mammogram screening discussed. Self breast awareness reviewed. Pap and HR HPV as above. Guidelines for Calcium, Vitamin D, regular exercise program including cardiovascular and weight bearing exercise.   Follow up annually and prn.   Additional counseling given.  {yes [id number]. _______ minutes face to face  time of which over 50% was spent in counseling.    After visit summary provided.

## 2022-05-23 ENCOUNTER — Ambulatory Visit: Payer: BC Managed Care – PPO | Admitting: Obstetrics and Gynecology

## 2022-12-27 ENCOUNTER — Ambulatory Visit: Payer: BC Managed Care – PPO | Admitting: Podiatry

## 2022-12-27 ENCOUNTER — Encounter: Payer: Self-pay | Admitting: Podiatry

## 2022-12-27 DIAGNOSIS — M7752 Other enthesopathy of left foot: Secondary | ICD-10-CM

## 2022-12-28 NOTE — Progress Notes (Signed)
Subjective:   Patient ID: Julia Horton, female   DOB: 62 y.o.   MRN: [id number]   HPI Patient presents stating she is getting ready to go out of town and wants to have medication in her ankle as it started to flareup again not to the same degree but wants it done neurovascular   ROS      Objective:  Physical Exam  Her status intact inflammation pain of the sinus tarsi left fluid buildup noted     Assessment:  Inflammatory capsulitis sinus tarsi left with pain     Plan:  Standard prep injected the sinus tarsi left 3 mg Kenalog 5 mg Xylocaine and also went ahead today and did dispense ankle compression stocking to help keep swelling down.  Reappoint as symptoms indicate

## 2023-01-19 ENCOUNTER — Encounter (HOSPITAL_BASED_OUTPATIENT_CLINIC_OR_DEPARTMENT_OTHER): Payer: Self-pay

## 2023-01-19 ENCOUNTER — Other Ambulatory Visit: Payer: Self-pay

## 2023-01-19 ENCOUNTER — Emergency Department (HOSPITAL_BASED_OUTPATIENT_CLINIC_OR_DEPARTMENT_OTHER)
Admission: EM | Admit: 2023-01-19 | Discharge: 2023-01-19 | Disposition: A | Discharge status: Home / Self Care | Payer: BC Managed Care – PPO | Attending: Emergency Medicine | Admitting: Emergency Medicine

## 2023-01-19 DIAGNOSIS — R220 Localized swelling, mass and lump, head: Secondary | ICD-10-CM

## 2023-01-19 DIAGNOSIS — T7840XA Allergy, unspecified, initial encounter: Secondary | ICD-10-CM | POA: Insufficient documentation

## 2023-01-19 LAB — CBC WITH DIFFERENTIAL/PLATELET
Abs Immature Granulocytes: 0.02 10*3/uL (ref 0.00–0.07)
Basophils Absolute: 0 10*3/uL (ref 0.0–0.1)
Basophils Relative: 0 %
Eosinophils Absolute: 0.2 10*3/uL (ref 0.0–0.5)
Eosinophils Relative: 3 %
HCT: 35.1 % — ABNORMAL LOW (ref 36.0–46.0)
Hemoglobin: 11.6 g/dL — ABNORMAL LOW (ref 12.0–15.0)
Immature Granulocytes: 0 %
Lymphocytes Relative: 50 %
Lymphs Abs: 2.7 10*3/uL (ref 0.7–4.0)
MCH: 31.1 pg (ref 26.0–34.0)
MCHC: 33 g/dL (ref 30.0–36.0)
MCV: 94.1 fL (ref 80.0–100.0)
Monocytes Absolute: 0.3 10*3/uL (ref 0.1–1.0)
Monocytes Relative: 5 %
Neutro Abs: 2.4 10*3/uL (ref 1.7–7.7)
Neutrophils Relative %: 42 %
Platelets: 287 10*3/uL (ref 150–400)
RBC: 3.73 MIL/uL — ABNORMAL LOW (ref 3.87–5.11)
RDW: 12.4 % (ref 11.5–15.5)
WBC: 5.5 10*3/uL (ref 4.0–10.5)
nRBC: 0 % (ref 0.0–0.2)

## 2023-01-19 LAB — BASIC METABOLIC PANEL
Anion gap: 10 (ref 5–15)
BUN: 12 mg/dL (ref 6–20)
CO2: 25 mmol/L (ref 22–32)
Calcium: 9.7 mg/dL (ref 8.9–10.3)
Chloride: 102 mmol/L (ref 98–111)
Creatinine, Ser: 0.88 mg/dL (ref 0.44–1.00)
GFR, Estimated: >60 mL/min (ref >60)
Glucose, Bld: 164 mg/dL — ABNORMAL HIGH (ref 70–99)
Potassium: 3.5 mmol/L (ref 3.5–5.1)
Sodium: 137 mmol/L (ref 135–145)

## 2023-01-19 MED ORDER — HYDROCHLOROTHIAZIDE 25 MG PO TABS: 25.0000 mg | ORAL_TABLET | Freq: Every day | ORAL | 1 refills | Status: DC

## 2023-01-19 MED ORDER — DIPHENHYDRAMINE HCL 50 MG/ML IJ SOLN
12.5000 mg | Freq: Once | INTRAMUSCULAR | Status: AC
  Administered 2023-01-19: 12.5 mg via INTRAVENOUS
  Filled 2023-01-19: qty 1

## 2023-01-19 MED ORDER — FAMOTIDINE IN NACL 20-0.9 MG/50ML-% IV SOLN
20.0000 mg | Freq: Once | INTRAVENOUS | Status: AC
  Administered 2023-01-19: 20 mg via INTRAVENOUS
  Filled 2023-01-19: qty 50

## 2023-01-19 NOTE — ED Provider Notes (Signed)
[place] EMERGENCY DEPARTMENT AT [facility] Provider Note   CSN: [id number] Arrival date & time: 01/19/23  0865     History  Chief Complaint  Patient presents with   Facial Swelling   Allergic Reaction    Julia Horton is a 62 y.o. female.  HPI   62 year old female presents emergency department with concern for facial swelling.  Patient states that it started yesterday, was mainly the upper lip however when she woke up today had moved to the left cheek.  She did not have any tongue swelling or throat swelling.  No itchiness in her throat, no voice change, no difficulty swallowing.  She is never had facial swelling before, no noted allergies.  She is on a combination pill with enalapril.  Home Medications Prior to Admission medications   Medication Sig Start Date End Date Taking? Authorizing Provider  hydrochlorothiazide (HYDRODIURIL) 25 MG tablet Take 1 tablet (25 mg total) by mouth daily. 01/19/23  Yes Marina Boerner, Clabe Seal, DO  amLODipine (NORVASC) 5 MG tablet Take 5 mg by mouth daily. 01/20/20   [provider]  metroNIDAZOLE (FLAGYL) 500 MG tablet Take 1 tablet (500 mg total) by mouth 2 (two) times daily. 02/07/22   Patton Salles, MD      Allergies    Patient has no known allergies.    Review of Systems   Review of Systems  Constitutional:  Negative for fever.  HENT:  Positive for facial swelling. Negative for drooling, trouble swallowing and voice change.   Respiratory:  Negative for shortness of breath and wheezing.   Cardiovascular:  Negative for chest pain.  Gastrointestinal:  Negative for abdominal pain, diarrhea, nausea and vomiting.  Skin:  Negative for rash.  Neurological:  Negative for headaches.    Physical Exam Updated Vital Signs BP 117/89   Pulse 68   Temp 98.3 F (36.8 C) (Oral)   Resp 20   Ht 5\' 1"  (1.549 m)   Wt 84 kg   LMP 08/24/2013 (Approximate)   SpO2 96%   BMI 34.99 kg/m  Physical Exam Vitals and  nursing note reviewed.  Constitutional:      Appearance: Normal appearance.  HENT:     Head: Normocephalic.     Mouth/Throat:     Mouth: Mucous membranes are moist.     Comments: Upper lip edema with left cheek edema, tongue is normal, uvula is nonswollen and midline, she is speaking in full sentences, no voice change, stridor or drooling, swallowing freely. Cardiovascular:     Rate and Rhythm: Normal rate.  Pulmonary:     Effort: Pulmonary effort is normal. No respiratory distress.  Abdominal:     Palpations: Abdomen is soft.     Tenderness: There is no abdominal tenderness.  Skin:    General: Skin is warm.  Neurological:     Mental Status: She is alert and oriented to person, place, and time. Mental status is at baseline.  Psychiatric:        Mood and Affect: Mood normal.     ED Results / Procedures / Treatments   Labs (all labs ordered are listed, but only abnormal results are displayed) Labs Reviewed  CBC WITH DIFFERENTIAL/PLATELET - Abnormal; Notable for the following components:      Result Value   RBC 3.73 (*)    Hemoglobin 11.6 (*)    HCT 35.1 (*)    All other components within normal limits  BASIC METABOLIC PANEL - Abnormal; Notable for  the following components:   Glucose, Bld 164 (*)    All other components within normal limits    EKG None  Radiology No results found.  Procedures Procedures    Medications Ordered in ED Medications  famotidine (PEPCID) IVPB 20 mg premix (0 mg Intravenous Stopped 01/19/23 1040)  diphenhydrAMINE (BENADRYL) injection 12.5 mg (12.5 mg Intravenous Given 01/19/23 1002)    ED Course/ Medical Decision Making/ A&P                                 Medical Decision Making Amount and/or Complexity of Data Reviewed Labs: ordered.  Risk Prescription drug management.   62 year old female presents emergency department with facial swelling, noted it yesterday and worsened today to the left cheek.  No intraoral involvement,  shortness of breath or other symptoms of anaphylaxis.  Vitals are normal and stable.  Patient is on a combination blood pressure pill that has enalapril.  She was treated with IV medicine on reevaluation the swelling is significantly improved and almost resolved.  Discussed with the patient the concern for the ACE inhibitor.  Will discontinue this medication and represcribe the hydrochlorothiazide part as a new prescription.  She will follow-up with her doctor for other further medication adjustments.  Otherwise she feels well, has had no worsening symptoms.  Will continue Benadryl per her recommendation.  She has been educated no other findings of angioedema/anaphylaxis.  Patient at this time appears safe and stable for discharge and close outpatient follow up. Discharge plan and strict return to ED precautions discussed, patient verbalizes understanding and agreement.        Final Clinical Impression(s) / ED Diagnoses Final diagnoses:  Facial swelling    Rx / DC Orders ED Discharge Orders          Ordered    hydrochlorothiazide (HYDRODIURIL) 25 MG tablet  Daily        01/19/23 1330              Jazarah Capili, Clabe Seal, DO 01/19/23 1404

## 2023-01-19 NOTE — ED Notes (Signed)
Patient given discharge instructions. Questions were answered. Patient verbalized understanding of discharge instructions and care at home.  

## 2023-01-19 NOTE — ED Triage Notes (Signed)
Patient arrives with complaints of facial swelling that started yesterday. Patient states that she is unsure of the cause. Swelling initially started on her lips, now increased swelling to face.

## 2023-01-19 NOTE — Discharge Instructions (Addendum)
You have been seen and discharged from the emergency department.  It is possible that your facial swelling was secondary to the enalapril aspect of your home high blood pressure medicine. This has been discontinued and I have sent a new prescription for hydrochlorothiazide.  Please fill this and take daily.  Follow-up with your doctor for any further medication adjustments.  Continue to take Benadryl 25 mg every 6-8 hours for the next 2 days.  Follow-up with your primary provider for further evaluation and further care. Take home medications as prescribed. If you have any worsening symptoms, acutely worsening facial swelling, tongue swelling, throat swelling, difficulty swallowing/voice change or further concerns for your health please return to an emergency department for further evaluation.

## 2023-01-19 NOTE — ED Notes (Signed)
Dr. Horton at bedside. 

## 2023-07-18 ENCOUNTER — Emergency Department (HOSPITAL_BASED_OUTPATIENT_CLINIC_OR_DEPARTMENT_OTHER): Admitting: Radiology

## 2023-07-18 ENCOUNTER — Encounter (HOSPITAL_BASED_OUTPATIENT_CLINIC_OR_DEPARTMENT_OTHER): Payer: Self-pay | Admitting: Emergency Medicine

## 2023-07-18 ENCOUNTER — Other Ambulatory Visit (HOSPITAL_BASED_OUTPATIENT_CLINIC_OR_DEPARTMENT_OTHER): Payer: Self-pay

## 2023-07-18 ENCOUNTER — Emergency Department (HOSPITAL_BASED_OUTPATIENT_CLINIC_OR_DEPARTMENT_OTHER)
Admission: EM | Admit: 2023-07-18 | Discharge: 2023-07-18 | Disposition: A | Discharge status: Home / Self Care | Attending: Emergency Medicine | Admitting: Emergency Medicine

## 2023-07-18 ENCOUNTER — Other Ambulatory Visit: Payer: Self-pay

## 2023-07-18 DIAGNOSIS — Z79899 Other long term (current) drug therapy: Secondary | ICD-10-CM | POA: Insufficient documentation

## 2023-07-18 DIAGNOSIS — R519 Headache, unspecified: Secondary | ICD-10-CM | POA: Diagnosis present

## 2023-07-18 DIAGNOSIS — I10 Essential (primary) hypertension: Secondary | ICD-10-CM | POA: Diagnosis not present

## 2023-07-18 LAB — BASIC METABOLIC PANEL WITH GFR
Anion gap: 11 (ref 5–15)
BUN: 9 mg/dL (ref 6–20)
CO2: 26 mmol/L (ref 22–32)
Calcium: 9.8 mg/dL (ref 8.9–10.3)
Chloride: 100 mmol/L (ref 98–111)
Creatinine, Ser: 0.69 mg/dL (ref 0.44–1.00)
GFR, Estimated: >60 mL/min (ref >60)
Glucose, Bld: 94 mg/dL (ref 70–99)
Potassium: 3.7 mmol/L (ref 3.5–5.1)
Sodium: 137 mmol/L (ref 135–145)

## 2023-07-18 LAB — CBC
HCT: 35.5 % — ABNORMAL LOW (ref 36.0–46.0)
Hemoglobin: 11.8 g/dL — ABNORMAL LOW (ref 12.0–15.0)
MCH: 31.4 pg (ref 26.0–34.0)
MCHC: 33.2 g/dL (ref 30.0–36.0)
MCV: 94.4 fL (ref 80.0–100.0)
Platelets: 296 10*3/uL (ref 150–400)
RBC: 3.76 MIL/uL — ABNORMAL LOW (ref 3.87–5.11)
RDW: 13.1 % (ref 11.5–15.5)
WBC: 6.4 10*3/uL (ref 4.0–10.5)
nRBC: 0 % (ref 0.0–0.2)

## 2023-07-18 LAB — TROPONIN T, HIGH SENSITIVITY: Troponin T High Sensitivity: <15 ng/L (ref <19)

## 2023-07-18 MED ORDER — HYDROCHLOROTHIAZIDE 25 MG PO TABS
25.0000 mg | ORAL_TABLET | Freq: Every day | ORAL | 1 refills | Status: DC
  Filled 2023-07-18: qty 30, 30d supply, fill #0
  Filled 2023-08-17: qty 30, 30d supply, fill #1

## 2023-07-18 MED ORDER — AMLODIPINE BESYLATE 5 MG PO TABS
5.0000 mg | ORAL_TABLET | Freq: Every day | ORAL | 1 refills | Status: DC
  Filled 2023-07-18: qty 30, 30d supply, fill #0
  Filled 2023-08-17: qty 30, 30d supply, fill #1

## 2023-07-18 NOTE — ED Notes (Signed)
 Pt given discharge instructions and reviewed prescriptions. Opportunities given for questions. Pt verbalizes understanding. Jillyn Hidden, RN

## 2023-07-18 NOTE — Discharge Instructions (Signed)
 Restart your blood pressure medicine.  Keep a daily log if possible make an appointment to follow-up with wellness clinic or if you find a new primary care doctor.  Return for any new or worse symptoms.

## 2023-07-18 NOTE — ED Triage Notes (Signed)
 High BP at works Feeling woozy, lightheaded Out of BP, needs new PCP

## 2023-07-18 NOTE — ED Provider Notes (Signed)
 [place] EMERGENCY DEPARTMENT AT [facility] Provider Note   CSN: [id number] Arrival date & time: 07/18/23  1202     History  Chief Complaint  Patient presents with   Hypertension    Julia Horton is a 63 y.o. female.  Patient was not feeling well at work today was feeling a little woozy lightheaded but no weakness or numbness or headache or chest pain or shortness of breath.  Maybe a little bit of blurred vision which seems okay now.  Patient states she has been out of her blood pressure medicines since at least a month.  Patient is supposed to be on Norvasc  5 mg and hydrochlorothiazide  25 mg.  Patient currently does not have a primary care doctor but is working on finding a new one and is trying to get seen by [employer].  No nausea vomiting no fevers.  No upper respiratory symptoms.       Home Medications Prior to Admission medications   Medication Sig Start Date End Date Taking? Authorizing Provider  amLODipine  (NORVASC ) 5 MG tablet Take 5 mg by mouth daily. 01/20/20   [provider]  hydrochlorothiazide  (HYDRODIURIL ) 25 MG tablet Take 1 tablet (25 mg total) by mouth daily. 01/19/23   Horton, Sidra Dredge, DO  metroNIDAZOLE  (FLAGYL ) 500 MG tablet Take 1 tablet (500 mg total) by mouth 2 (two) times daily. 02/07/22   Amundson C Silva, Brook E, MD      Allergies    Patient has no known allergies.    Review of Systems   Review of Systems  Constitutional:  Negative for chills and fever.  HENT:  Negative for ear pain and sore throat.   Eyes:  Negative for pain and visual disturbance.  Respiratory:  Negative for cough and shortness of breath.   Cardiovascular:  Negative for chest pain and palpitations.  Gastrointestinal:  Negative for abdominal pain, nausea and vomiting.  Genitourinary:  Negative for dysuria and hematuria.  Musculoskeletal:  Negative for arthralgias and back pain.  Skin:  Negative for color change and rash.  Neurological:  Positive for  light-headedness. Negative for dizziness, seizures, syncope, facial asymmetry, weakness, numbness and headaches.  All other systems reviewed and are negative.   Physical Exam Updated Vital Signs BP (!) 203/117 (BP Location: Right Arm)   Pulse 64   Temp 98.1 F (36.7 C)   Resp 13   Ht 1.626 m (5\' 4" )   Wt 81.6 kg   LMP 08/24/2013 (Approximate)   SpO2 99%   BMI 30.90 kg/m  Physical Exam Vitals and nursing note reviewed.  Constitutional:      General: She is not in acute distress.    Appearance: Normal appearance. She is well-developed.  HENT:     Head: Normocephalic and atraumatic.  Eyes:     Extraocular Movements: Extraocular movements intact.     Conjunctiva/sclera: Conjunctivae normal.     Pupils: Pupils are equal, round, and reactive to light.  Cardiovascular:     Rate and Rhythm: Normal rate and regular rhythm.     Heart sounds: No murmur heard. Pulmonary:     Effort: Pulmonary effort is normal. No respiratory distress.     Breath sounds: Normal breath sounds.  Abdominal:     Palpations: Abdomen is soft.     Tenderness: There is no abdominal tenderness.  Musculoskeletal:        General: No swelling.     Cervical back: Normal range of motion and neck supple.  Skin:  General: Skin is warm and dry.     Capillary Refill: Capillary refill takes less than 2 seconds.  Neurological:     General: No focal deficit present.     Mental Status: She is alert and oriented to person, place, and time.     Cranial Nerves: No cranial nerve deficit.     Sensory: No sensory deficit.     Motor: No weakness.  Psychiatric:        Mood and Affect: Mood normal.     ED Results / Procedures / Treatments   Labs (all labs ordered are listed, but only abnormal results are displayed) Labs Reviewed  CBC - Abnormal; Notable for the following components:      Result Value   RBC 3.76 (*)    Hemoglobin 11.8 (*)    HCT 35.5 (*)    All other components within normal limits  BASIC  METABOLIC PANEL WITH GFR  TROPONIN T, HIGH SENSITIVITY  TROPONIN T, HIGH SENSITIVITY    EKG None  Radiology DG Chest 2 View Result Date: 07/18/2023 CLINICAL DATA:  Dizziness.  Hypertension. EXAM: CHEST - 2 VIEW COMPARISON:  None Available. FINDINGS: The heart size and mediastinal contours are within normal limits. Both lungs are clear. The visualized skeletal structures are unremarkable. IMPRESSION: No active cardiopulmonary disease. Electronically Signed   By: Rosalene Colon M.D.   On: 07/18/2023 12:38    Procedures Procedures    Medications Ordered in ED Medications - No data to display  ED Course/ Medical Decision Making/ A&P                                 Medical Decision Making Amount and/or Complexity of Data Reviewed Labs: ordered. Radiology: ordered.   Patient nontoxic no acute distress.  No focal neurodeficits.  Patient's basic metabolic panel is normal GFR is greater than 60.  CBC white count 6.4 hemoglobin a little bit down to 11.8 platelets are 296.  Patient had a troponin done although no chest pain.  The first troponin was less than 15.  Chest x-ray no acute cardiopulmonary disease.  Will start patient back on her blood pressure medicine the Norvasc  and hydrochlorothiazide  have her pick them up today.  Have her follow-up either with wellness clinic or her new primary care doctor. Final Clinical Impression(s) / ED Diagnoses Final diagnoses:  Primary hypertension    Rx / DC Orders ED Discharge Orders     None         Nicklas Barns, MD 07/18/23 1445

## 2023-08-21 ENCOUNTER — Other Ambulatory Visit (HOSPITAL_BASED_OUTPATIENT_CLINIC_OR_DEPARTMENT_OTHER): Payer: Self-pay

## 2023-09-13 ENCOUNTER — Other Ambulatory Visit (HOSPITAL_BASED_OUTPATIENT_CLINIC_OR_DEPARTMENT_OTHER): Payer: Self-pay

## 2023-10-31 ENCOUNTER — Encounter (INDEPENDENT_AMBULATORY_CARE_PROVIDER_SITE_OTHER): Payer: Self-pay | Admitting: Primary Care

## 2023-10-31 ENCOUNTER — Ambulatory Visit (INDEPENDENT_AMBULATORY_CARE_PROVIDER_SITE_OTHER): Admitting: Primary Care

## 2023-10-31 ENCOUNTER — Encounter (INDEPENDENT_AMBULATORY_CARE_PROVIDER_SITE_OTHER): Payer: Self-pay

## 2023-10-31 VITALS — BP 168/91 | HR 78 | Resp 19 | Ht 64.0 in | Wt 184.6 lb

## 2023-10-31 DIAGNOSIS — I10 Essential (primary) hypertension: Secondary | ICD-10-CM

## 2023-10-31 DIAGNOSIS — E669 Obesity, unspecified: Secondary | ICD-10-CM | POA: Insufficient documentation

## 2023-10-31 DIAGNOSIS — Z6831 Body mass index (BMI) 31.0-31.9, adult: Secondary | ICD-10-CM

## 2023-10-31 DIAGNOSIS — E66811 Obesity, class 1: Secondary | ICD-10-CM

## 2023-10-31 DIAGNOSIS — Z7689 Persons encountering health services in other specified circumstances: Secondary | ICD-10-CM

## 2023-10-31 DIAGNOSIS — Z1211 Encounter for screening for malignant neoplasm of colon: Secondary | ICD-10-CM

## 2023-10-31 DIAGNOSIS — Z1231 Encounter for screening mammogram for malignant neoplasm of breast: Secondary | ICD-10-CM

## 2023-10-31 MED ORDER — HYDROCHLOROTHIAZIDE 25 MG PO TABS
25.0000 mg | ORAL_TABLET | Freq: Every day | ORAL | 1 refills | Status: AC
Start: 2023-10-31 — End: ?

## 2023-10-31 MED ORDER — HYDRALAZINE HCL 50 MG PO TABS
50.0000 mg | ORAL_TABLET | Freq: Once | ORAL | Status: AC
  Administered 2023-10-31: 50 mg via ORAL

## 2023-10-31 MED ORDER — AMLODIPINE BESYLATE 10 MG PO TABS: 10.0000 mg | ORAL_TABLET | Freq: Every day | ORAL | 1 refills | Status: AC

## 2023-10-31 NOTE — Progress Notes (Signed)
 Subjective:   Julia Horton is a 63 y.o. obese female presents for establish care. She is concern about Bp out of Bp medication for 2 moths.  Patient admits to having headaches and blurred vision. Patient has  No chest pain, No abdominal pain - No Nausea, No new weakness tingling or numbness, No Cough - shortness of breath/s .  She has been trying to manage her blood pressure by monitoring her sodium intake.  She reads label on foods for sodium content.  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   No current facility-administered medications on file prior to visit.    Review of System: ROS Comprehensive ROS Pertinent positive and negative noted in HPI   Objective:  BP (!) 165/92 (BP Location: Left Arm, Patient Position: Sitting, Cuff Size: Normal)   Pulse 78   Resp 19   Ht 5' 4 (1.626 m)   Wt 184 lb 9.6 oz (83.7 kg)   LMP 08/24/2013 (Approximate)   SpO2 100%   BMI 31.69 kg/m   Filed Weights   10/31/23 1520  Weight: 184 lb 9.6 oz (83.7 kg)    Physical Exam Vitals reviewed.  Constitutional:      Appearance: Normal appearance. She is obese.  HENT:     Head: Normocephalic.     Right Ear: Tympanic membrane, ear canal and external ear normal.     Left Ear: Tympanic membrane, ear canal and external ear normal.     Nose: Nose normal.     Mouth/Throat:     Mouth: Mucous membranes are moist.  Eyes:     Extraocular Movements: Extraocular movements intact.     Pupils: Pupils are equal, round, and reactive to light.  Cardiovascular:     Rate and Rhythm: Normal rate.  Pulmonary:     Effort: Pulmonary effort is normal.     Breath sounds: Normal breath sounds.  Abdominal:     General: Bowel sounds are normal.      Palpations: Abdomen is soft.  Musculoskeletal:        General: Normal range of motion.     Cervical back: Normal range of motion.  Skin:    General: Skin is warm and dry.  Neurological:     Mental Status: She is alert and oriented to person, place, and time.  Psychiatric:        Mood and Affect: Mood normal.        Behavior: Behavior normal.        Thought Content: Thought content normal.      Assessment:  Julia Horton was seen today for establish care.  Diagnoses and all orders for this visit:  Encounter to establish care  Encounter for screening mammogram for malignant neoplasm of breast -     MS 3D SCR MAMMO BILAT BR (aka MM); Future  Essential hypertension BP goal - < 130/80 Explained that having normal blood pressure is the goal and medications are helping to get to goal and maintain normal blood pressure. DIET: Limit salt intake, read nutrition labels to check salt content, limit fried and high fatty foods  Avoid using multisymptom OTC cold preparations that generally contain sudafed which can rise BP.  Consult with pharmacist on best cold relief products to use for persons with HTN EXERCISE Discussed incorporating exercise such as walking - 30 minutes most days of the week and can do in 10 minute intervals    -     amLODipine  (NORVASC ) 10 MG tablet; Take 1 tablet (10 mg total) by mouth daily. -     hydrochlorothiazide  (HYDRODIURIL ) 25 MG tablet; Take 1 tablet (25 mg total) by mouth daily.  Class 1 obesity due to excess calories with serious comorbidity and body mass index (BMI) of 31.0 to 31.9 in adult Obesity is 30-39 indicating an excess in caloric intake or underlining conditions. This may lead to other co-morbidities. Educated on lifestyle modifications of diet. exercise daily   Colon cancer screening -     Cologuard     This note has been created with Education officer, environmental. Any transcriptional errors are unintentional.    Return in about 4 weeks (around 11/28/2023) for re-check blood pressure.  Julia SHAUNNA Bohr, NP 10/31/2023, 4:08 PM

## 2023-11-26 ENCOUNTER — Encounter: Payer: Self-pay | Admitting: Internal Medicine

## 2023-11-26 ENCOUNTER — Ambulatory Visit: Payer: Self-pay

## 2023-11-26 ENCOUNTER — Ambulatory Visit: Attending: Internal Medicine | Admitting: Internal Medicine

## 2023-11-26 VITALS — BP 143/86 | HR 83 | Temp 98.4°F | Ht 64.0 in | Wt 184.0 lb

## 2023-11-26 DIAGNOSIS — J029 Acute pharyngitis, unspecified: Secondary | ICD-10-CM | POA: Diagnosis not present

## 2023-11-26 DIAGNOSIS — J209 Acute bronchitis, unspecified: Secondary | ICD-10-CM | POA: Diagnosis not present

## 2023-11-26 DIAGNOSIS — I10 Essential (primary) hypertension: Secondary | ICD-10-CM | POA: Diagnosis not present

## 2023-11-26 LAB — POCT RAPID STREP A (OFFICE): Rapid Strep A Screen: NEGATIVE

## 2023-11-26 MED ORDER — BENZONATATE 100 MG PO CAPS: 100.0000 mg | ORAL_CAPSULE | Freq: Three times a day (TID) | ORAL | 0 refills | Status: AC | PRN

## 2023-11-26 MED ORDER — AZITHROMYCIN 250 MG PO TABS: ORAL_TABLET | ORAL | 0 refills | Status: AC

## 2023-11-26 NOTE — Telephone Encounter (Signed)
 FYI Only or Action Required?: FYI only for provider.  Patient was last seen in primary care on 10/31/2023 by Celestia Rosaline SQUIBB, NP.  Called Nurse Triage reporting URI.  Symptoms began a week ago.  Interventions attempted: Rest, hydration, or home remedies.  Symptoms are: gradually worsening.  Triage Disposition: See HCP Within 4 Hours (Or PCP Triage)  Patient/caregiver understands and will follow disposition?: Yes, will follow disposition  Copied from CRM [id number]. Topic: Clinical - Red Word Triage >> Nov 26, 2023  9:16 AM Burnard DEL wrote: Red Word that prompted transfer to Nurse Triage: cough,feeling bad,discolored phlegm,sore throat Reason for Disposition  Wheezing is present  Answer Assessment - Initial Assessment Questions 1. ONSET: When did the cough begin?      About a week 2. SEVERITY: How bad is the cough today?      Once starts its not good 3. SPUTUM: Describe the color of your sputum (e.g., none, dry cough; clear, white, yellow, green)     Maybe like a light brown and yellow 4. HEMOPTYSIS: Are you coughing up any blood? If Yes, ask: How much? (e.g., flecks, streaks, tablespoons, etc.)     denies 5. DIFFICULTY BREATHING: Are you having difficulty breathing? If Yes, ask: How bad is it? (e.g., mild, moderate, severe)      denies 6. FEVER: Do you have a fever? If Yes, ask: What is your temperature, how was it measured, and when did it start?     denies 7. CARDIAC HISTORY: Do you have any history of heart disease? (e.g., heart attack, congestive heart failure)      denies 8. LUNG HISTORY: Do you have any history of lung disease?  (e.g., pulmonary embolus, asthma, emphysema)     denies 9. PE RISK FACTORS: Do you have a history of blood clots? (or: recent major surgery, recent prolonged travel, bedridden)     denies 10. OTHER SYMPTOMS: Do you have any other symptoms? (e.g., runny nose, wheezing, chest pain)       Sore throat, diarrhea,  malaise 12. TRAVEL: Have you traveled out of the country in the last month? (e.g., travel history, exposures)       denies  Protocols used: Cough - Acute Productive-A-AH

## 2023-11-26 NOTE — Telephone Encounter (Signed)
 Noted

## 2023-11-26 NOTE — Patient Instructions (Signed)
 VISIT SUMMARY:  Today, you were seen for a cough, wheezing, and sore throat that you have been experiencing for the past six days. You have been managing your symptoms with over-the-counter medications and have noticed some improvement. We also reviewed your hypertension management.  YOUR PLAN:  -ACUTE BRONCHITIS: Acute bronchitis is an inflammation of the bronchial tubes in the lungs, often causing cough, phlegm, and wheezing. You have been prescribed Zithromax  for five days to help with the infection and cough tablets to ease your symptoms. A strep test will be performed to rule out strep throat. Please wear a mask until your cough resolves and take today and tomorrow off from work to rest.  -HYPERTENSION: Hypertension, or high blood pressure, is a condition where the force of the blood against your artery walls is too high. Your blood pressure is slightly elevated but lower than usual. Continue taking your current medications, hydrochlorothiazide  and amlodipine , as prescribed. Check your blood pressure 3 times a week with goal being 130/80 or lower. You have a follow up visit with your primary provider next week. Please carrying your log with you. INSTRUCTIONS:  Please follow up if your symptoms do not improve or if they worsen. Continue to monitor your blood pressure regularly and keep a record of your readings. If you have any concerns or questions, do not hesitate to contact our office.

## 2023-11-26 NOTE — Progress Notes (Signed)
 Patient ID: Julia Horton, female    DOB: 05-05-1960  MRN: [id number]  CC: Cough (Cough - wheezing, sore throat, phlegm, malaise X6 days)   Subjective: Julia Horton is a 63 y.o. female who presents for UC visit Her concerns today include:  Pt with hx of HTN, tob user  Discussed the use of AI scribe software for clinical note transcription with the patient, who gave verbal consent to proceed.  History of Present Illness Julia Horton is a 63 year old female with hypertension who presents with a cough, wheezing, and sore throat for six days.  She has been experiencing a cough with wheezing, sore throat, and phlegm production for the past six days. The phlegm is described as brownish to yellowish in color. No shortness of breath or fever. She has been able to continue walking her dog. The sore throat has been persistent but is currently less severe after taking Tylenol , which she has been using to manage the symptoms. The sore throat tends to return once the medication wears off.  She has been taking extra strength Tylenol , Robitussin for congestion and cough, and consuming orange juice. She also uses Halls lozenges. She mentions exposure to a coworker who was coughing, which she suspects might be the source of her symptoms. She lives alone.  Regarding her hypertension, she takes hydrochlorothiazide  25 mg daily and amlodipine  10 mg daily, which she has already taken today. Her usual blood pressure readings can reach up to 180 mmHg. She does not check her blood pressure as regularly as she should.      Patient Active Problem List   Diagnosis Date Noted   Essential hypertension 10/31/2023   Obese 10/31/2023   Recurrent vaginitis 08/25/2018     Current Outpatient Medications on File Prior to Visit  Medication Sig Dispense Refill   amLODipine  (NORVASC ) 10 MG tablet Take 1 tablet (10 mg total) by mouth daily. 90 tablet 1   hydrochlorothiazide  (HYDRODIURIL ) 25 MG tablet Take 1  tablet (25 mg total) by mouth daily. 90 tablet 1   No current facility-administered medications on file prior to visit.    No Known Allergies  Social History   Socioeconomic History   Marital status: Single    Spouse name: Not on file   Number of children: Not on file   Years of education: Not on file   Highest education level: Not on file  Occupational History   Not on file  Tobacco Use   Smoking status: Light Smoker    Types: Cigarettes   Smokeless tobacco: Never   Tobacco comments:    Only smokes socially when out with friends  Vaping Use   Vaping status: Never Used  Substance and Sexual Activity   Alcohol use: Yes    Alcohol/week: 3.0 standard drinks of alcohol    Types: 3 Glasses of wine per week    Comment: Occ.   Drug use: No   Sexual activity: Yes    Partners: Male    Birth control/protection: Post-menopausal  Other Topics Concern   Not on file  Social History Narrative   Not on file   Social Drivers of Health   Financial Resource Strain: Not on file  Food Insecurity: Not on file  Transportation Needs: Not on file  Physical Activity: Not on file  Stress: Not on file  Social Connections: Not on file  Intimate Partner Violence: Not on file    Family History  Problem Relation Age of Onset  Other Father 62       dec from gunshot wound   Hypertension Father    Migraines Mother    Stroke Mother        TIA   Other Brother        MVA   Cancer Brother        Throat Ca    Past Surgical History:  Procedure Laterality Date   BREAST LUMPECTOMY Left    BREAST SURGERY  1985   benign left breast biopsy   CESAREAN SECTION  1989    ROS: Review of Systems Negative except as stated above  PHYSICAL EXAM: BP (!) 143/86 (BP Location: Left Arm, Patient Position: Sitting, Cuff Size: Normal)   Pulse 83   Temp 98.4 F (36.9 C) (Oral)   Ht 5' 4 (1.626 m)   Wt 184 lb (83.5 kg)   LMP 08/24/2013 (Approximate)   SpO2 98%   BMI 31.58 kg/m   Physical  Exam  General appearance - alert, well appearing, middle age AAF and in no distress. She has mild audible congestion and coughing spasms productive of sputum in my presence Mental status - normal mood, behavior, speech, dress, motor activity, and thought processes Nose -clear mucus.  Passages patent. Mouth -oral mucosa is moist.  Mild erythema of the throat but she was just sucking on a red Halls Cough Drop.  No exudates seen Neck - supple, no significant adenopathy Chest -rhonchi heard with coughing otherwise lungs are clear. Heart - normal rate, regular rhythm, normal S1, S2, no murmurs, rubs, clicks or gallops  Results for orders placed or performed in visit on 11/26/23  Rapid Strep A   Collection Time: 11/26/23  1:26 PM  Result Value Ref Range   Rapid Strep A Screen Negative Negative        Latest Ref Rng & Units 07/18/2023   12:22 PM 01/19/2023    9:48 AM  CMP  Glucose 70 - 99 mg/dL 94  835   BUN 6 - 20 mg/dL 9  12   Creatinine 9.55 - 1.00 mg/dL 9.30  9.11   Sodium 864 - 145 mmol/L 137  137   Potassium 3.5 - 5.1 mmol/L 3.7  3.5   Chloride 98 - 111 mmol/L 100  102   CO2 22 - 32 mmol/L 26  25   Calcium 8.9 - 10.3 mg/dL 9.8  9.7    Lipid Panel  No results found for: CHOL, TRIG, HDL, CHOLHDL, VLDL, LDLCALC, LDLDIRECT  CBC    Component Value Date/Time   WBC 6.4 07/18/2023 1222   RBC 3.76 (L) 07/18/2023 1222   HGB 11.8 (L) 07/18/2023 1222   HCT 35.5 (L) 07/18/2023 1222   PLT 296 07/18/2023 1222   MCV 94.4 07/18/2023 1222   MCH 31.4 07/18/2023 1222   MCHC 33.2 07/18/2023 1222   RDW 13.1 07/18/2023 1222   LYMPHSABS 2.7 01/19/2023 0948   MONOABS 0.3 01/19/2023 0948   EOSABS 0.2 01/19/2023 0948   BASOSABS 0.0 01/19/2023 0948    ASSESSMENT AND PLAN:  Assessment and Plan Assessment & Plan Acute bronchitis Acute bronchitis with cough, sputum, sore throat, and wheezing. COVID-19 and influenza less likely. COVID-19 testing not indicated. - Prescribe  Zithromax  for five days. - Prescribe cough tablets. - Perform strep test. - Advise wearing a mask until cough resolves. - Advise absence from work today and tomorrow. Letter given  Pharyngitis -rapid strep test negative  Hypertension Not at goal.  She has not been checking blood  pressure regularly.  She has an appointment with her PCP next week.  Advised to check blood pressure at least 3 times over the next 1 week and bring her readings with her when she sees her PCP.  Continue amlodipine  10 mg and HCTZ 25 mg daily for now.     Patient was given the opportunity to ask questions.  Patient verbalized understanding of the plan and was able to repeat key elements of the plan.   This documentation was completed using Paediatric nurse.  Any transcriptional errors are unintentional.  Orders Placed This Encounter  Procedures   Rapid Strep A     Requested Prescriptions   Signed Prescriptions Disp Refills   azithromycin  (ZITHROMAX  Z-PAK) 250 MG tablet 6 each 0    Sig: 2 tabs PO x 1 then 1 tab Po daily   benzonatate  (TESSALON  PERLES) 100 MG capsule 20 capsule 0    Sig: Take 1 capsule (100 mg total) by mouth 3 (three) times daily as needed.    No follow-ups on file.  Barnie Louder, MD, FACP

## 2023-11-28 ENCOUNTER — Encounter: Payer: Self-pay | Admitting: Podiatry

## 2023-11-28 ENCOUNTER — Ambulatory Visit: Admitting: Podiatry

## 2023-11-28 DIAGNOSIS — M7752 Other enthesopathy of left foot: Secondary | ICD-10-CM | POA: Diagnosis not present

## 2023-11-28 MED ORDER — TRIAMCINOLONE ACETONIDE 10 MG/ML IJ SUSP
10.0000 mg | Freq: Once | INTRAMUSCULAR | Status: AC
  Administered 2023-11-28: 10 mg via INTRA_ARTICULAR

## 2023-11-29 NOTE — Progress Notes (Signed)
 Subjective:   Patient ID: Julia Horton, female   DOB: 63 y.o.   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

## 2023-12-02 ENCOUNTER — Ambulatory Visit (INDEPENDENT_AMBULATORY_CARE_PROVIDER_SITE_OTHER): Admitting: Primary Care

## 2024-03-25 ENCOUNTER — Emergency Department (HOSPITAL_BASED_OUTPATIENT_CLINIC_OR_DEPARTMENT_OTHER)
Admission: EM | Admit: 2024-03-25 | Discharge: 2024-03-25 | Discharge status: Against Medical Advice | Attending: Emergency Medicine | Admitting: Emergency Medicine

## 2024-03-25 ENCOUNTER — Other Ambulatory Visit: Payer: Self-pay

## 2024-03-25 ENCOUNTER — Encounter (HOSPITAL_BASED_OUTPATIENT_CLINIC_OR_DEPARTMENT_OTHER): Payer: Self-pay | Admitting: Emergency Medicine

## 2024-03-25 DIAGNOSIS — Z5321 Procedure and treatment not carried out due to patient leaving prior to being seen by health care provider: Secondary | ICD-10-CM | POA: Diagnosis not present

## 2024-03-25 DIAGNOSIS — R21 Rash and other nonspecific skin eruption: Secondary | ICD-10-CM | POA: Diagnosis present

## 2024-03-25 NOTE — ED Triage Notes (Addendum)
 Patient has generalized urticaria all over upper thighs, under breasts, and on arms.  Patient states it itches.  Patient ate cherry tomatoes last night. Patient denies respiratory involvement.
